# Patient Record
Sex: Female | Born: 1960 | Race: Black or African American | Hispanic: No | Marital: Single | Smoking: Former smoker
Health system: Southern US, Community
[De-identification: ages and names within clinical notes are randomized; demographics above are authoritative.]

## PROBLEM LIST (undated history)

## (undated) DIAGNOSIS — E669 Obesity, unspecified: Secondary | ICD-10-CM

## (undated) DIAGNOSIS — Z86718 Personal history of other venous thrombosis and embolism: Secondary | ICD-10-CM

## (undated) DIAGNOSIS — F419 Anxiety disorder, unspecified: Secondary | ICD-10-CM

## (undated) DIAGNOSIS — M542 Cervicalgia: Secondary | ICD-10-CM

## (undated) DIAGNOSIS — I10 Essential (primary) hypertension: Secondary | ICD-10-CM

## (undated) DIAGNOSIS — E559 Vitamin D deficiency, unspecified: Secondary | ICD-10-CM

## (undated) DIAGNOSIS — K760 Fatty (change of) liver, not elsewhere classified: Secondary | ICD-10-CM

## (undated) DIAGNOSIS — M25519 Pain in unspecified shoulder: Secondary | ICD-10-CM

## (undated) DIAGNOSIS — E785 Hyperlipidemia, unspecified: Secondary | ICD-10-CM

## (undated) DIAGNOSIS — M2550 Pain in unspecified joint: Secondary | ICD-10-CM

## (undated) HISTORY — PX: BREAST SURGERY: SHX581

## (undated) HISTORY — DX: Obesity, unspecified: E66.9

## (undated) HISTORY — DX: Pain in unspecified shoulder: M25.519

## (undated) HISTORY — DX: Fatty (change of) liver, not elsewhere classified: K76.0

## (undated) HISTORY — PX: REDUCTION MAMMAPLASTY: SUR839

## (undated) HISTORY — DX: Pain in unspecified joint: M25.50

## (undated) HISTORY — DX: Personal history of other venous thrombosis and embolism: Z86.718

## (undated) HISTORY — DX: Anxiety disorder, unspecified: F41.9

## (undated) HISTORY — DX: Cervicalgia: M54.2

## (undated) HISTORY — DX: Vitamin D deficiency, unspecified: E55.9

---

## 2007-01-18 ENCOUNTER — Encounter: Admission: RE | Admit: 2007-01-18 | Discharge: 2007-01-18 | Payer: Self-pay | Admitting: Internal Medicine

## 2010-07-31 HISTORY — PX: DILATION AND CURETTAGE OF UTERUS: SHX78

## 2010-07-31 HISTORY — PX: ABDOMINAL HYSTERECTOMY: SHX81

## 2010-07-31 HISTORY — PX: COLONOSCOPY: SHX174

## 2010-08-03 ENCOUNTER — Encounter (INDEPENDENT_AMBULATORY_CARE_PROVIDER_SITE_OTHER): Payer: Self-pay | Admitting: *Deleted

## 2010-08-08 ENCOUNTER — Encounter (INDEPENDENT_AMBULATORY_CARE_PROVIDER_SITE_OTHER): Payer: Self-pay

## 2010-08-09 ENCOUNTER — Ambulatory Visit
Admission: RE | Admit: 2010-08-09 | Discharge: 2010-08-09 | Payer: Self-pay | Source: Home / Self Care | Attending: Internal Medicine | Admitting: Internal Medicine

## 2010-08-21 ENCOUNTER — Encounter: Payer: Self-pay | Admitting: Internal Medicine

## 2010-08-22 ENCOUNTER — Telehealth: Payer: Self-pay | Admitting: Internal Medicine

## 2010-08-23 ENCOUNTER — Ambulatory Visit
Admission: RE | Admit: 2010-08-23 | Discharge: 2010-08-23 | Payer: Self-pay | Source: Home / Self Care | Attending: Internal Medicine | Admitting: Internal Medicine

## 2010-08-23 ENCOUNTER — Encounter: Payer: Self-pay | Admitting: Internal Medicine

## 2010-09-01 ENCOUNTER — Encounter (HOSPITAL_COMMUNITY)
Admission: RE | Admit: 2010-09-01 | Discharge: 2010-09-01 | Disposition: A | Discharge status: Home / Self Care | Payer: 59 | Source: Ambulatory Visit | Attending: Obstetrics and Gynecology | Admitting: Obstetrics and Gynecology

## 2010-09-01 DIAGNOSIS — Z01810 Encounter for preprocedural cardiovascular examination: Secondary | ICD-10-CM | POA: Insufficient documentation

## 2010-09-01 DIAGNOSIS — Z01812 Encounter for preprocedural laboratory examination: Secondary | ICD-10-CM | POA: Insufficient documentation

## 2010-09-01 LAB — COMPREHENSIVE METABOLIC PANEL
ALT: 11 U/L (ref 0–35)
ALT: 39 U/L — ABNORMAL HIGH (ref 0–35)
AST: 8 U/L (ref 0–37)
AST: 41 U/L — ABNORMAL HIGH (ref 0–37)
Albumin: 2.7 g/dL — ABNORMAL LOW (ref 3.5–5.2)
Alkaline Phosphatase: 102 U/L (ref 39–117)
BUN: 5 mg/dL — ABNORMAL LOW (ref 6–23)
CO2: 21 mEq/L (ref 19–32)
Calcium: 9.2 mg/dL (ref 8.4–10.5)
Chloride: 105 mEq/L (ref 96–112)
Creatinine, Ser: 0.72 mg/dL (ref 0.4–1.2)
GFR calc non Af Amer: >60 mL/min (ref >60)
Glucose, Bld: 98 mg/dL (ref 70–99)
Potassium: 4 mEq/L (ref 3.5–5.1)
Sodium: 138 mEq/L (ref 135–145)
Total Bilirubin: 0.3 mg/dL (ref 0.3–1.2)
Total Protein: 6.2 g/dL (ref 6.0–8.3)

## 2010-09-01 LAB — URINALYSIS, ROUTINE W REFLEX MICROSCOPIC
Bilirubin Urine: NEGATIVE
Hgb urine dipstick: NEGATIVE
Protein, ur: NEGATIVE mg/dL
Urine Glucose, Fasting: NEGATIVE mg/dL
Urobilinogen, UA: 0.2 mg/dL (ref 0.0–1.0)

## 2010-09-01 LAB — CBC
HCT: 34.7 % — ABNORMAL LOW (ref 36.0–46.0)
HCT: 38.7 % (ref 36.0–46.0)
Hemoglobin: 11.1 g/dL — ABNORMAL LOW (ref 12.0–15.0)
MCV: 86.2 fL (ref 78.0–100.0)
MCV: 89 fL (ref 78.0–100.0)
Platelets: 230 10*3/uL (ref 150–400)
RBC: 4.49 MIL/uL (ref 3.87–5.11)
WBC: 8 10*3/uL (ref 4.0–10.5)

## 2010-09-01 NOTE — Letter (Signed)
Summary: Pre Visit Letter Revised  [facility]  [address] [place], [place] [address]   Phone: [phone]  Fax: [phone]        08/03/2010 MRN: [id number] Tanya Escobar [address] [place], [place]  [address]             Procedure Date:  08/23/2010 8:00   Direct colon-Dr. Juanda Chance   Welcome to the Gastroenterology Division at [facility].    You are scheduled to see a nurse for your pre-procedure visit on 08/09/09 at 3:30 on the 3rd floor at [facility], [address]  We ask that you try to arrive at our office 15 minutes prior to your appointment time to allow for check-in.  Please take a minute to review the attached form.  If you answer "Yes" to one or more of the questions on the first page, we ask that you call the person listed at your earliest opportunity.  If you answer "No" to all of the questions, please complete the rest of the form and bring it to your appointment.    Your nurse visit will consist of discussing your medical and surgical history, your immediate family medical history, and your medications.   If you are unable to list all of your medications on the form, please bring the medication bottles to your appointment and we will list them.  We will need to be aware of both prescribed and over the counter drugs.  We will need to know exact dosage information as well.    Please be prepared to read and sign documents such as consent forms, a financial agreement, and acknowledgement forms.  If necessary, and with your consent, a friend or relative is welcome to sit-in on the nurse visit with you.  Please bring your insurance card so that we may make a copy of it.  If your insurance requires a referral to see a specialist, please bring your referral form from your primary care physician.  No co-pay is required for this nurse visit.     If you cannot keep your appointment, please call [phone] to cancel or reschedule prior to  your appointment date.  This allows [place] the opportunity to schedule an appointment for another patient in need of care.    Thank you for choosing [facility] for your medical needs.  We appreciate the opportunity to care for you.  Please visit [place] at our website  to learn more about our practice.  Sincerely, The Gastroenterology Division

## 2010-09-01 NOTE — Procedures (Signed)
Summary: Colonoscopy  Patient: Tanya Escobar Note: All result statuses are Final unless otherwise noted.  Tests: (1) Colonoscopy (COL)   COL Colonoscopy           DONE     [facility]     [address]     [place], [place]  [address]           COLONOSCOPY PROCEDURE REPORT           PATIENT:  Tanya Escobar, Tanya Escobar  MR#:  [id number]     BIRTHDATE:  25-Apr-1961, 49 yrs. old  GENDER:  female     ENDOSCOPIST:  Hedwig Morton. Juanda Chance, MD     REF. BY:  Harold Hedge, M.D.     PROCEDURE DATE:  08/23/2010     PROCEDURE:  Colonoscopy [address]2725     ASA CLASS:  Class II     INDICATIONS:  family history of colon cancer mother with colon     cancer in her 31'ies     MEDICATIONS:   Versed 10 mg, Fentanyl 100 mcg           DESCRIPTION OF PROCEDURE:   After the risks benefits and     alternatives of the procedure were thoroughly explained, informed     consent was obtained.  Digital rectal exam was performed and     revealed no rectal masses.   The LB 180AL [id number] endoscope was     introduced through the anus and advanced to the cecum, which was     identified by both the appendix and ileocecal valve, without     limitations.  The quality of the prep was good, using MiraLax.     The instrument was then slowly withdrawn as the colon was fully     examined.     <<PROCEDUREIMAGES>>           FINDINGS:  No polyps or cancers were seen (see image1, image2,     image3, and image4).   Retroflexed views in the rectum revealed no     abnormalities.    The scope was then withdrawn from the patient     and the procedure completed.           COMPLICATIONS:  None     ENDOSCOPIC IMPRESSION:     1) No polyps or cancers     2) Normal colonoscopy     RECOMMENDATIONS:     1) high fiber diet     REPEAT EXAM:  In 5 year(s) for.           ______________________________     Hedwig Morton. Juanda Chance, MD           CC:           n.     eSIGNED:   Hedwig Morton. Airi Copado at 08/23/2010 08:31 AM           Eveline Keto,  [id number]  Note: An exclamation mark (!) indicates a result that was not dispersed into the flowsheet. Document Creation Date: 08/23/2010 8:31 AM _______________________________________________________________________  (1) Order result status: Final Collection or observation date-time: 08/23/2010 08:25 Requested date-time:  Receipt date-time:  Reported date-time:  Referring Physician:   Ordering Physician: Lina Sar [phone]) Specimen Source:  Source: Launa Grill Order Number: [id number] Lab site:   Appended Document: Colonoscopy    Clinical Lists Changes  Observations: Added new observation of COLONNXTDUE: 08/2015 (08/23/2010 15:13)

## 2010-09-01 NOTE — Progress Notes (Signed)
Summary: Procedure questions  Phone Note Call from Patient Call back at Home Phone [phone] Call back at [phone]   Caller: Patient Call For: Dr. Juanda Chance Reason for Call: Talk to Nurse Summary of Call: Patient has questions about procedure tomorrow Initial call taken by: [place] Johnson,  August 22, 2010 9:59 AM  Follow-up for Phone Call        Returned pt's call and answered her question concerning being on menstual period and ok to wear tampon or pad. Follow-up by: Alease Frame RN,  August 22, 2010 11:09 AM

## 2010-09-01 NOTE — Miscellaneous (Signed)
Summary: Lec previsit  Clinical Lists Changes  Medications: Added new medication of MIRALAX   POWD (POLYETHYLENE GLYCOL 3350) As per prep  instructions. - Signed Added new medication of REGLAN 10 MG  TABS (METOCLOPRAMIDE HCL) As per prep instructions. - Signed Added new medication of DULCOLAX 5 MG  TBEC (BISACODYL) Day before procedure take 2 at 3pm and 2 at 8pm. - Signed Rx of MIRALAX   POWD (POLYETHYLENE GLYCOL 3350) As per prep  instructions.;  #255gm x 0;  Signed;  Entered by: Ulis Rias RN;  Authorized by: Hart Carwin MD;  Method used: Electronically to [facility]  [address] [id number] [address] [place], [place]  [address], Ph: [phone] or [phone], Fax: [phone] Rx of REGLAN 10 MG  TABS (METOCLOPRAMIDE HCL) As per prep instructions.;  #2 x 0;  Signed;  Entered by: Ulis Rias RN;  Authorized by: Hart Carwin MD;  Method used: Electronically to [facility]  [address] [id number] [address] [place], [place]  [address], Ph: [phone] or [phone], Fax: [phone] Rx of DULCOLAX 5 MG  TBEC (BISACODYL) Day before procedure take 2 at 3pm and 2 at 8pm.;  #4 x 0;  Signed;  Entered by: Ulis Rias RN;  Authorized by: Hart Carwin MD;  Method used: Electronically to [facility]  [address] [id number] [address] [place], [place]  [address], Ph: [phone] or [phone], Fax: [phone] Observations: Added new observation of NKA: T (08/09/2010 10:35)    Prescriptions: DULCOLAX 5 MG  TBEC (BISACODYL) Day before procedure take 2 at 3pm and 2 at 8pm.  #4 x 0   Entered by:   Ulis Rias RN   Authorized by:   Hart Carwin MD   Signed by:   Ulis Rias RN on 08/09/2010   Method used:   Electronically to        [facility] [id number] (retail)       [address]       [place], [place]  [address]       Ph: [phone] or [phone]       Fax: [phone]   RxID:   [id number] REGLAN 10 MG  TABS (METOCLOPRAMIDE HCL) As  per prep instructions.  #2 x 0   Entered by:   Ulis Rias RN   Authorized by:   Hart Carwin MD   Signed by:   Ulis Rias RN on 08/09/2010   Method used:   [facility]ronically to        [facility]  [address] [facility] ([facility])       [address]       [place], [place]  [address]       Ph: [phone] or [phone]       Fax: [phone]   RxID:   [id number] MIRALAX   POWD (POLYETHYLENE GLYCOL 3350) As per prep  instructions.  #255gm x 0   Entered by:   Ulis Rias RN   Authorized by:   Hart Carwin MD   Signed by:   Ulis Rias RN on 08/09/2010   Method used:   [facility]ronically to        [facility]  [address] [id number]       [address]       [place], [place]  [address]       Ph:  [phone] or [phone]       Fax: [phone]   RxID:   [id number]

## 2010-09-01 NOTE — Letter (Signed)
Summary: [facility] Instructions  [facility]  [address] [place], [place] [address]   Phone: [phone]  Fax: [phone]       Tanya Escobar    07-Dec-1960    MRN: [id number]       Procedure Day /Date:  Tuesday 08/23/2010     Arrival Time: 7:30 am     Procedure Time: 8:00 am     Location of Procedure:                    _ x_  [facility] (4th Floor)    PREPARATION FOR COLONOSCOPY WITH MIRALAX  Starting 5 days prior to your procedure Thursday 1/19 do not eat nuts, seeds, popcorn, corn, beans, peas,  salads, or any raw vegetables.  Do not take any fiber supplements (e.g. Metamucil, Citrucel, and Benefiber). ____________________________________________________________________________________________________   THE DAY BEFORE YOUR PROCEDURE         DATE: Monday 1/23  1   Drink clear liquids the entire day-NO SOLID FOOD  2   Do not drink anything colored red or purple.  Avoid juices with pulp.  No orange juice.  3   Drink at least 64 oz. (8 glasses) of fluid/clear liquids during the day to prevent dehydration and help the prep work efficiently.  CLEAR LIQUIDS INCLUDE: Water Jello Ice Popsicles Tea (sugar ok, no milk/cream) Powdered fruit flavored drinks Coffee (sugar ok, no milk/cream) Gatorade Juice: apple, white grape, white cranberry  Lemonade Clear bullion, consomm, broth Carbonated beverages (any kind) Strained chicken noodle soup Hard Candy  4   Mix the entire bottle of Miralax with 64 oz. of Gatorade/Powerade in the morning and put in the refrigerator to chill.  5   At 3:00 pm take 2 Dulcolax/Bisacodyl tablets.  6   At 4:30 pm take one Reglan/Metoclopramide tablet.  7  Starting at 5:00 pm drink one 8 oz glass of the Miralax mixture every 15-20 minutes until you have finished drinking the entire 64 oz.  You should finish drinking prep around 7:30 or 8:00 pm.  8   If you are nauseated, you may take the 2nd Reglan/Metoclopramide  tablet at 6:30 pm.        9    At 8:00 pm take 2 more DULCOLAX/Bisacodyl tablets.     THE DAY OF YOUR PROCEDURE      DATE:  Tuesday 1/24  You may drink clear liquids until 6:00 am  (2 HOURS BEFORE PROCEDURE).   MEDICATION INSTRUCTIONS  Unless otherwise instructed, you should take regular prescription medications with a small sip of water as early as possible the morning of your procedure.  Additional medication instructions: Do not take Benicar/HCTZ am of procedure         OTHER INSTRUCTIONS  You will need a responsible adult at least 51 years of age to accompany you and drive you home.   This person must remain in the waiting room during your procedure.  Wear loose fitting clothing that is easily removed.  Leave jewelry and other valuables at home.  However, you may wish to bring a book to read or an iPod/MP3 player to listen to music as you wait for your procedure to start.  Remove all body piercing jewelry and leave at home.  Total time from sign-in until discharge is approximately 2-3 hours.  You should go home directly after your procedure and rest.  You can resume normal activities the day after your procedure.  The day of  your procedure you should not:   Drive   Make legal decisions   Operate machinery   Drink alcohol   Return to work  You will receive specific instructions about eating, activities and medications before you leave.   The above instructions have been reviewed and explained to me by   Ulis Rias RN  August 09, 2010 10:57 AM     I fully understand and can verbalize these instructions _____________________________ Date _______

## 2010-09-05 NOTE — H&P (Signed)
  Tanya Escobar, Tanya Escobar              ACCOUNT NO.:  [id number]  MEDICAL RECORD NO.:  [id number]          PATIENT TYPE:  AMB  LOCATION:  [facility]                           [facility]ILITY:  [facility]  PHYSICIAN:  Guy Sandifer. Henderson Cloud, M.D. DATE OF BIRTH:  1960-08-30  DATE OF ADMISSION:  09/08/2010 DATE OF DISCHARGE:                             HISTORY & PHYSICAL   CHIEF COMPLAINT:  Heavy menses.  HISTORY OF PRESENT ILLNESS:  This patient is a 50 year old divorced black female G1, P1, status post tubal ligation, who has a history of uterine leiomyomata and increasingly heavy menses.  Ultrasound in my office on August 10, 2010 revealed uterus measuring 10.5 x 6.7 x 8.9 cm.  Multiple intramural leiomyomata are noted ranging from 1.0 to 3.1 cm.  There is a 5.2-cm subserosal fibroid.  On sonohysterogram, there are multiple masses ranging from 12-19 mm, consistent with polyps and/or possible submucous fibroids.  Ovaries appeared normal.  After discussion of options, she is being admitted for hysteroscopy, resectoscope, dilatation and curettage.  Potential risks and complications have been discussed preoperatively.  PAST MEDICAL HISTORY: 1. Abnormal Pap smear. 2. Chronic hypertension. 3. Depression. 4. Headache.  PAST SURGICAL HISTORY:  Breast reduction in 2000, tubal ligation in 1995.  OBSTETRICAL HISTORY:  Vaginal delivery x2.  Cesarean section x1.  FAMILY HISTORY:  Positive for asthma, hepatitis, hiatal hernia, arthritis, diabetes, chronic hypertension, breast and colon cancer.  MEDICATIONS:  Losartan daily, sertraline daily, Loestrin daily, Dolgic Plus p.r.n. headache.  No known drug allergies.  SOCIAL HISTORY:  Denies tobacco, alcohol, or drug abuse.  REVIEW OF SYSTEMS:  NEUROLOGIC:  Headache as above.  CARDIAC:  Denies chest pain.  PULMONARY:  Denies shortness of breath.  PHYSICAL EXAMINATION:  VITAL SIGNS:  Height 5 feet 3 inches, weight 216 pounds, blood pressure 128/82. LUNGS:  Clear to  auscultation. HEART:  Regular rate and rhythm. ABDOMEN:  Obese, soft, nontender without masses. PELVIC:  Uterus is enlarged although exam is highly compromised due to the patient's habitus. EXTREMITIES:  Grossly within normal limits. NEUROLOGICAL:  Grossly within normal limits.  ASSESSMENT:  Menorrhagia.  PLAN:  Hysteroscopy, resectoscope, dilatation and curettage.     Guy Sandifer Henderson Cloud, M.D.     JET/MEDQ  D:  09/02/2010  T:  09/03/2010  Job:  [id number]  Electronically Signed by Harold Hedge M.D. on 09/05/2010 09:03:19 AM

## 2010-09-08 ENCOUNTER — Ambulatory Visit (HOSPITAL_COMMUNITY)
Admission: RE | Admit: 2010-09-08 | Discharge: 2010-09-08 | Disposition: A | Discharge status: Home / Self Care | Payer: 59 | Source: Ambulatory Visit | Attending: Obstetrics and Gynecology | Admitting: Obstetrics and Gynecology

## 2010-09-08 ENCOUNTER — Other Ambulatory Visit: Payer: Self-pay | Admitting: Obstetrics and Gynecology

## 2010-09-08 DIAGNOSIS — Z01812 Encounter for preprocedural laboratory examination: Secondary | ICD-10-CM | POA: Insufficient documentation

## 2010-09-08 DIAGNOSIS — I10 Essential (primary) hypertension: Secondary | ICD-10-CM | POA: Insufficient documentation

## 2010-09-08 DIAGNOSIS — N920 Excessive and frequent menstruation with regular cycle: Secondary | ICD-10-CM | POA: Insufficient documentation

## 2010-09-08 DIAGNOSIS — D251 Intramural leiomyoma of uterus: Secondary | ICD-10-CM | POA: Insufficient documentation

## 2010-09-08 DIAGNOSIS — D252 Subserosal leiomyoma of uterus: Secondary | ICD-10-CM | POA: Insufficient documentation

## 2010-09-08 DIAGNOSIS — N840 Polyp of corpus uteri: Secondary | ICD-10-CM | POA: Insufficient documentation

## 2010-09-08 DIAGNOSIS — D250 Submucous leiomyoma of uterus: Secondary | ICD-10-CM | POA: Insufficient documentation

## 2010-09-08 DIAGNOSIS — Z01818 Encounter for other preprocedural examination: Secondary | ICD-10-CM | POA: Insufficient documentation

## 2010-09-17 NOTE — Op Note (Signed)
  [name], STOCKBURGER              ACCOUNT NO.:  [id number]  MEDICAL RECORD NO.:  [id number]           PATIENT TYPE:  O  LOCATION:  [facility]                          [facility]ILITY:  [facility]  PHYSICIAN:  Guy Sandifer. Henderson Cloud, M.D. DATE OF BIRTH:  [birth year]  DATE OF PROCEDURE:  09/08/2010 DATE OF DISCHARGE:                              OPERATIVE REPORT   PREOPERATIVE DIAGNOSIS:  Menorrhagia.  POSTOPERATIVE DIAGNOSIS:  Menorrhagia.  PROCEDURES:  Hysteroscopy with resection of submucous fibroid and endometrial polyp with MyoSure and dilatation and curettage.  SURGEON:  Guy Sandifer. Henderson Cloud, MD  ANESTHESIA:  General with LMA.  SPECIMENS: 1. Endometrial section. 2. Endometrial curettings.  Both to Pathology.  I and Os of distending media, 170 mL deficit.  INDICATIONS AND CONSENT:  This patient is a 50 year old divorced black female G1, P1, status post tubal ligation with heavy menses.  Details are dictated in the history and physical.  Hysteroscopy, resection of uterine endocavitary masses, dilatation and curettage was discussed with the patient preoperatively.  Potential risks and complications have been discussed preoperatively including but not limited to infection, uterine perforation, organ damage, bleeding requiring transfusion of blood products with HIV and hepatitis acquisition, DVT, PE, pneumonia, laparotomy, laparoscopy, hysterectomy, recurrent abnormal bleeding, pelvic pain, painful intercourse.  All questions have been answered and consent is signed on the chart.  FINDINGS:  There is approximately 15-mm submucous fibroid in the center of the posterior uterine wall and a 2-cm pedunculated polypoid-type structure at the 3 o'clock position on the mid endometrial cavity on the left.  Both fallopian tube ostia identified.  PROCEDURE IN DETAILS:  The patient was taken to the operating room where she was identified and general anesthesia was induced via LMA.  She was then placed in dorsal  lithotomy position.  Time-out was undertaken.  She was prepped, bladder straight catheterized and draped in a sterile fashion.  Bivalve speculum was placed in the vagina.  Anterior cervical lip was injected with 0.5% plain Xylocaine and grasped with single-tooth tenaculum.  Paracervical block at 2, 4, 5, 7, 8 and 10 o'clock positions was placed with approximately 20 mL of the same solution.  The MyoSure 0- degree hysteroscope was then placed in the endocervical canal and advanced under direct visualization using distending media.  The above findings are noted.  Under good visualization, the submucous leiomyoma was resected with the MyoSure down to the level of the surrounding tissue.  The polyp was also resected in a similar fashion.  Reinspection revealed the cavity to be otherwise clean.  The MyoSure device was then removed.  Curettage was then carried out and specimen was sent to pathology as well.  Good hemostasis was noted.  All instruments were removed.  All counts were correct.  The patient was awakened, taken to recovery room in stable condition.     Guy Sandifer Henderson Cloud, M.D.     JET/MEDQ  D:  09/08/2010  T:  09/08/2010  Job:  [id number]  Electronically Signed by Harold Hedge M.D. on 09/17/2010 12:10:19 PM

## 2011-01-26 ENCOUNTER — Ambulatory Visit (HOSPITAL_COMMUNITY): Admission: RE | Admit: 2011-01-26 | Payer: Self-pay | Source: Ambulatory Visit | Admitting: Obstetrics and Gynecology

## 2011-06-26 ENCOUNTER — Other Ambulatory Visit: Payer: Self-pay | Admitting: Obstetrics and Gynecology

## 2012-06-10 ENCOUNTER — Other Ambulatory Visit: Payer: Self-pay | Admitting: Internal Medicine

## 2012-06-10 DIAGNOSIS — Z1231 Encounter for screening mammogram for malignant neoplasm of breast: Secondary | ICD-10-CM

## 2012-07-16 ENCOUNTER — Ambulatory Visit
Admission: RE | Admit: 2012-07-16 | Discharge: 2012-07-16 | Disposition: A | Discharge status: Home / Self Care | Payer: 59 | Source: Ambulatory Visit | Attending: Internal Medicine | Admitting: Internal Medicine

## 2012-07-16 DIAGNOSIS — Z1231 Encounter for screening mammogram for malignant neoplasm of breast: Secondary | ICD-10-CM

## 2014-03-24 ENCOUNTER — Encounter: Payer: Self-pay | Admitting: Internal Medicine

## 2014-04-01 ENCOUNTER — Other Ambulatory Visit: Payer: Self-pay | Admitting: Internal Medicine

## 2014-04-01 DIAGNOSIS — Z1231 Encounter for screening mammogram for malignant neoplasm of breast: Secondary | ICD-10-CM

## 2014-04-14 ENCOUNTER — Ambulatory Visit
Admission: RE | Admit: 2014-04-14 | Discharge: 2014-04-14 | Disposition: A | Discharge status: Home / Self Care | Payer: 59 | Source: Ambulatory Visit | Attending: Internal Medicine | Admitting: Internal Medicine

## 2014-04-14 DIAGNOSIS — Z1231 Encounter for screening mammogram for malignant neoplasm of breast: Secondary | ICD-10-CM

## 2014-10-13 ENCOUNTER — Emergency Department (HOSPITAL_COMMUNITY): Payer: 59

## 2014-10-13 ENCOUNTER — Emergency Department (HOSPITAL_COMMUNITY)
Admission: EM | Admit: 2014-10-13 | Discharge: 2014-10-13 | Disposition: A | Discharge status: Home / Self Care | Payer: 59 | Attending: Emergency Medicine | Admitting: Emergency Medicine

## 2014-10-13 ENCOUNTER — Encounter (HOSPITAL_COMMUNITY): Payer: Self-pay | Admitting: *Deleted

## 2014-10-13 DIAGNOSIS — I82621 Acute embolism and thrombosis of deep veins of right upper extremity: Secondary | ICD-10-CM | POA: Diagnosis not present

## 2014-10-13 DIAGNOSIS — Z880 Allergy status to penicillin: Secondary | ICD-10-CM | POA: Insufficient documentation

## 2014-10-13 DIAGNOSIS — M79631 Pain in right forearm: Secondary | ICD-10-CM | POA: Diagnosis present

## 2014-10-13 DIAGNOSIS — I10 Essential (primary) hypertension: Secondary | ICD-10-CM | POA: Diagnosis not present

## 2014-10-13 DIAGNOSIS — M79609 Pain in unspecified limb: Secondary | ICD-10-CM | POA: Diagnosis not present

## 2014-10-13 DIAGNOSIS — M7989 Other specified soft tissue disorders: Secondary | ICD-10-CM | POA: Diagnosis not present

## 2014-10-13 DIAGNOSIS — R51 Headache: Secondary | ICD-10-CM | POA: Insufficient documentation

## 2014-10-13 DIAGNOSIS — M79604 Pain in right leg: Secondary | ICD-10-CM | POA: Diagnosis not present

## 2014-10-13 DIAGNOSIS — E785 Hyperlipidemia, unspecified: Secondary | ICD-10-CM | POA: Diagnosis not present

## 2014-10-13 DIAGNOSIS — Z79899 Other long term (current) drug therapy: Secondary | ICD-10-CM | POA: Diagnosis not present

## 2014-10-13 HISTORY — DX: Essential (primary) hypertension: I10

## 2014-10-13 HISTORY — DX: Hyperlipidemia, unspecified: E78.5

## 2014-10-13 LAB — BASIC METABOLIC PANEL
Anion gap: 7 (ref 5–15)
BUN: 12 mg/dL (ref 6–23)
CO2: 25 mmol/L (ref 19–32)
Calcium: 9.6 mg/dL (ref 8.4–10.5)
Chloride: 105 mmol/L (ref 96–112)
Creatinine, Ser: 0.77 mg/dL (ref 0.50–1.10)
GFR calc Af Amer: >90 mL/min (ref >90)
GFR calc non Af Amer: >90 mL/min (ref >90)
Glucose, Bld: 108 mg/dL — ABNORMAL HIGH (ref 70–99)
Potassium: 3.8 mmol/L (ref 3.5–5.1)
Sodium: 137 mmol/L (ref 135–145)

## 2014-10-13 LAB — BRAIN NATRIURETIC PEPTIDE: B Natriuretic Peptide: 7.1 pg/mL (ref 0.0–100.0)

## 2014-10-13 LAB — CBC WITH DIFFERENTIAL/PLATELET
Basophils Absolute: 0 10*3/uL (ref 0.0–0.1)
Basophils Relative: 0 % (ref 0–1)
Eosinophils Absolute: 0.1 10*3/uL (ref 0.0–0.7)
Eosinophils Relative: 1 % (ref 0–5)
HCT: 43.1 % (ref 36.0–46.0)
Hemoglobin: 14.7 g/dL (ref 12.0–15.0)
Lymphocytes Relative: 33 % (ref 12–46)
Lymphs Abs: 2.1 10*3/uL (ref 0.7–4.0)
MCH: 29.8 pg (ref 26.0–34.0)
MCHC: 34.1 g/dL (ref 30.0–36.0)
MCV: 87.2 fL (ref 78.0–100.0)
Monocytes Absolute: 0.4 10*3/uL (ref 0.1–1.0)
Monocytes Relative: 7 % (ref 3–12)
Neutro Abs: 3.8 10*3/uL (ref 1.7–7.7)
Neutrophils Relative %: 59 % (ref 43–77)
Platelets: 206 10*3/uL (ref 150–400)
RBC: 4.94 MIL/uL (ref 3.87–5.11)
RDW: 14.2 % (ref 11.5–15.5)
WBC: 6.4 10*3/uL (ref 4.0–10.5)

## 2014-10-13 LAB — PROTIME-INR
INR: 0.97 (ref 0.00–1.49)
Prothrombin Time: 13 seconds (ref 11.6–15.2)

## 2014-10-13 LAB — I-STAT TROPONIN, ED: Troponin i, poc: 0 ng/mL (ref 0.00–0.08)

## 2014-10-13 LAB — APTT: aPTT: 28 seconds (ref 24–37)

## 2014-10-13 MED ORDER — XARELTO VTE STARTER PACK 15 & 20 MG PO TBPK: 15.0000 mg | ORAL_TABLET | ORAL | Status: DC

## 2014-10-13 MED ORDER — IOHEXOL 350 MG/ML SOLN
100.0000 mL | Freq: Once | INTRAVENOUS | Status: AC | PRN
  Administered 2014-10-13: 100 mL via INTRAVENOUS

## 2014-10-13 MED ORDER — RIVAROXABAN 15 MG PO TABS
15.0000 mg | ORAL_TABLET | Freq: Once | ORAL | Status: AC
  Administered 2014-10-13: 15 mg via ORAL
  Filled 2014-10-13: qty 1

## 2014-10-13 NOTE — Progress Notes (Addendum)
*  Preliminary Results* Right upper extremity venous duplex completed. Right upper extremity is positive for acute occlusive deep vein thrombosis involving the right radial and ulnar veins.  Preliminary results discussed with Noland Fordyce, PA-C.  10/13/2014 11:42 AM  Maudry Mayhew, RVT, RDCS, RDMS

## 2014-10-13 NOTE — Progress Notes (Signed)
*  PRELIMINARY RESULTS* Vascular Ultrasound Right lower extremity venous duplex has been completed.  Preliminary findings: negative for DVT   Landry Mellow, RDMS, RVT  10/13/2014, 1:17 PM

## 2014-10-13 NOTE — ED Notes (Signed)
Patient transported to CT 

## 2014-10-13 NOTE — ED Notes (Signed)
Pt states that she has had rt arm pain and swelling since yesterday. Pt states that she has been seen for pain in the arm recently. Pt noted to have swelling in her rt forearm. No heat/reddness/tenderness noted. Equal pulses and cap refill.

## 2014-10-13 NOTE — ED Provider Notes (Signed)
CSN: [id number]     Arrival date & time 10/13/14  0706 History   First MD Initiated Contact with Patient 10/13/14 1032     Chief Complaint  Patient presents with  . Extremity Pain     (Consider location/radiation/quality/duration/timing/severity/associated sxs/prior Treatment) HPI  Pt is a 54yo female with hx of HTN and hyperlipidemia, presenting to ED with multiple complaints including pain and swelling of right forearm.  Pt states she has been having right forearm pain intermittently for 2 months as well as right leg pain and intermittent headaches.  Right arm pain is aching and sore, 4/10 at worst.  Pain was worse this morning when she woke up, she also noticed new onset mild swelling in her right forearm as well as a difference in BP in her right vs left arm.  Pt states she was scheduled to f/u with cardiology tomorrow as scheduled by her PCP.  She reports mild SOB, states it feels like it is difficult to swallow at times. Denies SOB or CP at this time. Denies leg swelling but does report right leg pain that is aching and burning at times. Reports having a nerve study done, ordered by her PCP to r/o neuropathy. Pt states that test was normal. She denies lower back pain. Denies recent injury to right arm or right leg.  Denies change in skin color in right arm or right leg.  Denies fever, chills, n/v/d.   Past Medical History  Diagnosis Date  . Hypertension   . Hyperlipidemia    Past Surgical History  Procedure Laterality Date  . Abdominal hysterectomy    . Breast surgery     No family history on file. History  Substance Use Topics  . Smoking status: Never Smoker   . Smokeless tobacco: Not on file  . Alcohol Use: Yes     Comment: wine occasionally   OB History    No data available     Review of Systems  Constitutional: Negative for fever and chills.  Respiratory: Negative for cough and shortness of breath.   Cardiovascular: Negative for chest pain, palpitations and leg  swelling.  Gastrointestinal: Negative for nausea, vomiting, abdominal pain and diarrhea.  Musculoskeletal: Positive for myalgias. Negative for back pain, joint swelling, arthralgias, neck pain and neck stiffness.       Right arm and leg pain, right arm swelling  Skin: Negative for color change, pallor, rash and wound.  Neurological: Positive for headaches. Negative for light-headedness.  All other systems reviewed and are negative.     Allergies  Penicillins  Home Medications   Prior to Admission medications   Medication Sig Start Date End Date Taking? Authorizing Provider  Flaxseed, Linseed, (FLAX SEED OIL) 1000 MG CAPS Take 1,000 mg by mouth daily.   Yes Historical Provider, MD  fluticasone (FLONASE) 50 MCG/ACT nasal spray Place 1 spray into both nostrils as needed for allergies or rhinitis.   Yes Historical Provider, MD  losartan (COZAAR) 25 MG tablet Take 25 mg by mouth daily.   Yes Historical Provider, MD  Omega-3 Fatty Acids (FISH OIL) 1000 MG CAPS Take 1 capsule by mouth daily.   Yes Historical Provider, MD  PROAIR HFA 108 (90 BASE) MCG/ACT inhaler Inhale 1 puff into the lungs every 6 (six) hours as needed for shortness of breath.  09/30/14  Yes Historical Provider, MD  sertraline (ZOLOFT) 50 MG tablet Take 75 mg by mouth daily. 10/06/14  Yes Historical Provider, MD  simvastatin (ZOCOR) 20 MG tablet Take  20 mg by mouth every evening. 10/06/14  Yes Historical Provider, MD  valACYclovir (VALTREX) 500 MG tablet Take 500 mg by mouth daily.   Yes Historical Provider, MD  XARELTO STARTER PACK 15 & 20 MG TBPK Take 15-20 mg by mouth as directed. Take as directed on package: Start with one 15mg  tablet by mouth twice a day with food. On Day 22, switch to one 20mg  tablet once a day with food. 10/13/14   Noland Fordyce, PA-C   BP 128/77 mmHg  Pulse 79  Temp(Src) 98.5 F (36.9 C) (Oral)  Resp 17  Ht 5\' 3"  (1.6 m)  Wt 209 lb (94.802 kg)  BMI 37.03 kg/m2  SpO2 99% Physical Exam  Constitutional:  She appears well-developed and well-nourished. No distress.  HENT:  Head: Normocephalic and atraumatic.  Eyes: Conjunctivae are normal. No scleral icterus.  Neck: Normal range of motion.  Cardiovascular: Normal rate, regular rhythm and normal heart sounds.   Pulmonary/Chest: Effort normal and breath sounds normal. No respiratory distress. She has no wheezes. She has no rales. She exhibits no tenderness.  Abdominal: Soft. Bowel sounds are normal. She exhibits no distension and no mass. There is no tenderness. There is no rebound and no guarding.  Musculoskeletal: Normal range of motion. She exhibits edema and tenderness.  Mild edema to right forearm compared to left. FROM right shoulder, elbow, and wrist. Mild tenderness to volar aspect right forearm.  5/5 strength bilaterally in upper and lower extremities. Right leg: FROM, no calf tenderness   Neurological: She is alert.  Skin: Skin is warm and dry. No rash noted. She is not diaphoretic. No erythema.  Nursing note and vitals reviewed.   ED Course  Procedures (including critical care time) Labs Review Labs Reviewed  BASIC METABOLIC PANEL - Abnormal; Notable for the following:    Glucose, Bld 108 (*)    All other components within normal limits  CBC WITH DIFFERENTIAL/PLATELET  BRAIN NATRIURETIC PEPTIDE  APTT  PROTIME-INR  Randolm Idol, ED    Imaging Review Dg Chest 2 View  10/13/2014   CLINICAL DATA:  Difficulty breathing.  Hypertension  EXAM: CHEST  2 VIEW  COMPARISON:  None.  FINDINGS: Lungs are clear. Heart size and pulmonary vascularity are normal. No adenopathy. No bone lesions.  IMPRESSION: No edema or consolidation.   Electronically Signed   By: Lowella Grip III M.D.   On: 10/13/2014 12:50   Ct Angio Chest Pe W/cm &/or Wo Cm  10/13/2014   CLINICAL DATA:  Right upper extremity DVT.  Short of breath.  EXAM: CT ANGIOGRAPHY CHEST WITH CONTRAST  TECHNIQUE: Multidetector CT imaging of the chest was performed using the  standard protocol during bolus administration of intravenous contrast. Multiplanar CT image reconstructions and MIPs were obtained to evaluate the vascular anatomy.  CONTRAST:  132mL OMNIPAQUE IOHEXOL 350 MG/ML SOLN  COMPARISON:  None.  FINDINGS: No filling defects in the pulmonary arterial tree to suggest acute pulmonary thromboembolism  No abnormal mediastinal adenopathy.  No pneumothorax.  No pleural effusion.  Clear lungs.  No acute bony deformity. Posterior disc osteophytes occur from T6 through T10. They encroach upon the central canal.  Review of the MIP images confirms the above findings.  IMPRESSION: No evidence of acute pulmonary thromboembolism.   Electronically Signed   By: Marybelle Killings M.D.   On: 10/13/2014 15:40     EKG Interpretation None      MDM   Final diagnoses:  Arm DVT (deep venous thromboembolism), acute, right  Right leg pain    Pt is a 54yo female presenting to ED with multiple complaints, but mainly right arm pain and swelling prompted pt to come to ED today.  Pt has also had intermittent headaches, SOB, and right leg pain. Pt is scheduled for f/u with cardiology, Dr. Nadyne Coombes tomorrow as pt was concerned for different BP in right and left arm.  Pt denies CP or SOB today.   Mild edema with tenderness in right forearm.  Right leg pain sounds sciatic pain in nature.  No calf tenderness, redness, warmth or edema.  Venous doppler of Right Upper extremity: positive for acute occlusive deep vein thrombosis involving the right radial and ulnar veins.  Due to Right Upper extremity being positive for DVT,  Right Lower extremity venous duplex also performed. Negative for DVT.  With c/o intermittent SOB, CT angio chest performed to r/o PE although lower concern as pt denies CP or SOB at this time, has heart-regular rate and rhythm, O2 Sat 97-100% on RA.  CT angio chest: negative for PE.  Pt started on Xarelto in ED. Will discharge home with starter pack. Encouraged pt to f/u  with Dr. Nadyne Coombes tomorrow as previously scheduled. Home care instructions provided.  Return precautions provided. Pt verbalized understanding and agreement with tx plan.  Discussed pt with Dr. Ralene Bathe during ED visit.  Agrees with assessment and plan.   Noland Fordyce, PA-C 10/13/14 [place], MD 10/13/14 [phone]

## 2014-10-13 NOTE — ED Notes (Signed)
IV obtained; could not get blood. Phlebotomy to be called.

## 2014-10-20 ENCOUNTER — Encounter (HOSPITAL_COMMUNITY): Payer: Self-pay | Admitting: Emergency Medicine

## 2014-10-20 ENCOUNTER — Emergency Department (HOSPITAL_COMMUNITY)
Admission: EM | Admit: 2014-10-20 | Discharge: 2014-10-20 | Disposition: A | Discharge status: Home / Self Care | Payer: 59 | Attending: Emergency Medicine | Admitting: Emergency Medicine

## 2014-10-20 ENCOUNTER — Ambulatory Visit (HOSPITAL_COMMUNITY)
Admission: RE | Admit: 2014-10-20 | Discharge: 2014-10-20 | Disposition: A | Discharge status: Home / Self Care | Payer: 59 | Source: Ambulatory Visit | Attending: Emergency Medicine | Admitting: Emergency Medicine

## 2014-10-20 DIAGNOSIS — E663 Overweight: Secondary | ICD-10-CM | POA: Insufficient documentation

## 2014-10-20 DIAGNOSIS — I10 Essential (primary) hypertension: Secondary | ICD-10-CM | POA: Insufficient documentation

## 2014-10-20 DIAGNOSIS — E785 Hyperlipidemia, unspecified: Secondary | ICD-10-CM | POA: Diagnosis not present

## 2014-10-20 DIAGNOSIS — Z79899 Other long term (current) drug therapy: Secondary | ICD-10-CM | POA: Diagnosis not present

## 2014-10-20 DIAGNOSIS — M79602 Pain in left arm: Secondary | ICD-10-CM | POA: Diagnosis present

## 2014-10-20 DIAGNOSIS — Z880 Allergy status to penicillin: Secondary | ICD-10-CM | POA: Insufficient documentation

## 2014-10-20 DIAGNOSIS — R51 Headache: Secondary | ICD-10-CM | POA: Diagnosis not present

## 2014-10-20 DIAGNOSIS — Z87891 Personal history of nicotine dependence: Secondary | ICD-10-CM | POA: Insufficient documentation

## 2014-10-20 DIAGNOSIS — Z86718 Personal history of other venous thrombosis and embolism: Secondary | ICD-10-CM

## 2014-10-20 DIAGNOSIS — M7989 Other specified soft tissue disorders: Secondary | ICD-10-CM | POA: Diagnosis not present

## 2014-10-20 DIAGNOSIS — I82621 Acute embolism and thrombosis of deep veins of right upper extremity: Secondary | ICD-10-CM | POA: Diagnosis not present

## 2014-10-20 MED ORDER — KETOROLAC TROMETHAMINE 60 MG/2ML IM SOLN
60.0000 mg | Freq: Once | INTRAMUSCULAR | Status: AC
  Administered 2014-10-20: 60 mg via INTRAMUSCULAR
  Filled 2014-10-20: qty 2

## 2014-10-20 NOTE — Progress Notes (Signed)
VASCULAR LAB PRELIMINARY  PRELIMINARY  PRELIMINARY  PRELIMINARY  Bilateral upper extremity venous duplex completed.    Preliminary report:  Follow up DVT right radial vein from October 13, 2014 reveals negative DVT bilaterally.  August Albino, RVT 10/20/2014, 11:59 AM

## 2014-10-20 NOTE — ED Provider Notes (Signed)
CSN: [id number]     Arrival date & time 10/20/14  3474 History  This chart was scribed for Tanya Hacker, MD by Rayfield Citizen, ED Scribe. This patient was seen in room A12C/A12C and the patient's care was started at 2:56 AM.    Chief Complaint  Patient presents with  . Arm Pain   Patient is a 54 y.o. female presenting with arm pain. The history is provided by the patient. No language interpreter was used.  Arm Pain Associated symptoms include headaches. Pertinent negatives include no chest pain, no abdominal pain and no shortness of breath.    HPI Comments: Tanya Escobar is a 54 y.o. female who presents to the Emergency Department complaining of left arm pain. Patient states she woke from sleep tonight to use the restroom, and while washing her hands, she noticed that her upper left arm was swollen and warm and tender to touch. Patient notes that she does sleep on that arm regularly and was sleeping on it before she woke tonight. She also reports a right-sided headache, rated 4/10, radiating down the right side of her neck. No treatments or medications tried PTA. She denies chest pain.   Patient was treated for a DVT on 10/13/14; per records, her pain began two months PTA in the back of her right calf but gradually migrated to her right arm. She states she would feel intermittent throbbing pain in her upper right arm. She was seen in the ED at that time and was found positive by venous doppler for acute occlusive DVT involving the right radial and ulnar veins. CT angio chest neg.  She was discharged home with a starter pack of Xarelto; she states she has been compliant with her discharge instructions.   Past Medical History  Diagnosis Date  . Hypertension   . Hyperlipidemia    Past Surgical History  Procedure Laterality Date  . Abdominal hysterectomy    . Breast surgery     No family history on file. History  Substance Use Topics  . Smoking status: Former Research scientist (life sciences)  . Smokeless  tobacco: Not on file  . Alcohol Use: Yes     Comment: wine occasionally   OB History    No data available     Review of Systems  Constitutional: Negative for fever.  Respiratory: Negative for chest tightness and shortness of breath.   Cardiovascular: Negative for chest pain.  Gastrointestinal: Negative for nausea, vomiting and abdominal pain.  Genitourinary: Negative for dysuria.  Musculoskeletal:       Arm pain and swelling  Skin: Negative for rash and wound.  Neurological: Positive for headaches.  Psychiatric/Behavioral: Negative for confusion.  All other systems reviewed and are negative.     Allergies  Penicillins  Home Medications   Prior to Admission medications   Medication Sig Start Date End Date Taking? Authorizing Provider  Flaxseed, Linseed, (FLAX SEED OIL) 1000 MG CAPS Take 1,000 mg by mouth daily.   Yes Historical Provider, MD  fluticasone (FLONASE) 50 MCG/ACT nasal spray Place 1 spray into both nostrils as needed for allergies or rhinitis.   Yes Historical Provider, MD  losartan-hydrochlorothiazide (HYZAAR) 50-12.5 MG per tablet Take 1 tablet by mouth daily.   Yes Historical Provider, MD  Omega-3 Fatty Acids (FISH OIL) 1000 MG CAPS Take 1 capsule by mouth daily.   Yes Historical Provider, MD  PROAIR HFA 108 (90 BASE) MCG/ACT inhaler Inhale 1 puff into the lungs every 6 (six) hours as needed for shortness  of breath.  09/30/14  Yes Historical Provider, MD  sertraline (ZOLOFT) 50 MG tablet Take 75 mg by mouth daily. 10/06/14  Yes Historical Provider, MD  simvastatin (ZOCOR) 20 MG tablet Take 20 mg by mouth every evening. 10/06/14  Yes Historical Provider, MD  valACYclovir (VALTREX) 500 MG tablet Take 500 mg by mouth daily.   Yes Historical Provider, MD  XARELTO STARTER PACK 15 & 20 MG TBPK Take 15-20 mg by mouth as directed. Take as directed on package: Start with one 15mg  tablet by mouth twice a day with food. On Day 22, switch to one 20mg  tablet once a day with food.  10/13/14  Yes Noland Fordyce, PA-C  losartan (COZAAR) 25 MG tablet Take 25 mg by mouth daily.    Historical Provider, MD   BP 134/72 mmHg  Pulse 100  Temp(Src) 97.8 F (36.6 C) (Oral)  Resp 10  Ht 5\' 3"  (1.6 m)  Wt 207 lb (93.895 kg)  BMI 36.68 kg/m2  SpO2 100% Physical Exam  Constitutional: She is oriented to person, place, and time. She appears well-developed and well-nourished.  Overweight  HENT:  Head: Normocephalic and atraumatic.  Eyes: Pupils are equal, round, and reactive to light.  Cardiovascular: Normal rate, regular rhythm and normal heart sounds.   No murmur heard. Pulmonary/Chest: Effort normal and breath sounds normal. No respiratory distress. She has no wheezes.  Abdominal: Soft. There is no tenderness.  Musculoskeletal:  Mild swelling noted to the left upper extremities on the lateral humerus, warm to touch, 2+ radial pulses, range of motion intact of the elbow and shoulder  Neurological: She is alert and oriented to person, place, and time.  Skin: Skin is warm and dry.  Psychiatric: She has a normal mood and affect.  Nursing note and vitals reviewed.   ED Course  Procedures   DIAGNOSTIC STUDIES: Oxygen Saturation is 100% on RA, normal by my interpretation.    COORDINATION OF CARE: 3:03 AM Discussed treatment plan with pt at bedside and pt agreed to plan.   Labs Review Labs Reviewed - No data to display  Imaging Review No results found.   EKG Interpretation None      MDM   Final diagnoses:  Arm pain, central, left  History of DVT (deep vein thrombosis)   Patient presents with pain and swelling of the left arm. Reports that this is new awakening earlier this morning.  Nontoxic on exam. Vital signs notable for mild tachycardia. Otherwise 100% on room air. Patient recently diagnosed with DVT and had a CT anterior chest that was negative. She is on Xarelto. Low suspicion at this time for DVT on the left given that the patient is on appropriate  therapy. However, will have patient return for ultrasound of the left upper extremity later today. She has no shortness of breath or other symptoms of PE and do not feel she needs CT imaging at this time. Patient was given Toradol for her headache. It appears that during her last presentation she also reported intermittent headaches. Suspect tension headache.  After history, exam, and medical workup I feel the patient has been appropriately medically screened and is safe for discharge home. Pertinent diagnoses were discussed with the patient. Patient was given return precautions.  I personally performed the services described in this documentation, which was scribed in my presence. The recorded information has been reviewed and is accurate.      Tanya Hacker, MD 10/20/14 [phone]

## 2014-10-20 NOTE — ED Notes (Signed)
Patient denies chest pain but states she has shortness of breath.

## 2014-10-20 NOTE — ED Notes (Addendum)
ptstates she woke up an hour ago and felt tenderness in the arm.  Noticed swelling and redness along the upper left arm area.    States that she has some tingling, throbbing in her head, also some pain in her neck.  States that the neck and arm pain has been intermittent throughout the day.  States that she had a DVT in right arm last week and is on the Xarelto starter pack.

## 2014-10-20 NOTE — Discharge Instructions (Signed)
You were seen today for pain and swelling in the left upper arm. He had a history of blood clots. You are on appropriate therapy for blood clots. However, you need to come back tomorrow to get imaging of the arm to ensure that you do not have a new clot as this might change or medication regimen. If you have any new or worsening symptoms before returning tomorrow he should be reevaluated immediately. These include chest pain, shortness of breath.

## 2014-10-30 ENCOUNTER — Ambulatory Visit: Payer: Self-pay | Admitting: Internal Medicine

## 2014-11-01 ENCOUNTER — Encounter (HOSPITAL_COMMUNITY): Payer: Self-pay | Admitting: Emergency Medicine

## 2014-11-01 DIAGNOSIS — R0602 Shortness of breath: Secondary | ICD-10-CM | POA: Diagnosis not present

## 2014-11-01 DIAGNOSIS — Z87891 Personal history of nicotine dependence: Secondary | ICD-10-CM | POA: Diagnosis not present

## 2014-11-01 DIAGNOSIS — Z79899 Other long term (current) drug therapy: Secondary | ICD-10-CM | POA: Insufficient documentation

## 2014-11-01 DIAGNOSIS — Z7901 Long term (current) use of anticoagulants: Secondary | ICD-10-CM | POA: Diagnosis not present

## 2014-11-01 DIAGNOSIS — Z880 Allergy status to penicillin: Secondary | ICD-10-CM | POA: Diagnosis not present

## 2014-11-01 DIAGNOSIS — M542 Cervicalgia: Secondary | ICD-10-CM | POA: Diagnosis not present

## 2014-11-01 DIAGNOSIS — R51 Headache: Secondary | ICD-10-CM | POA: Diagnosis not present

## 2014-11-01 DIAGNOSIS — R0789 Other chest pain: Secondary | ICD-10-CM | POA: Insufficient documentation

## 2014-11-01 DIAGNOSIS — E785 Hyperlipidemia, unspecified: Secondary | ICD-10-CM | POA: Insufficient documentation

## 2014-11-01 DIAGNOSIS — I10 Essential (primary) hypertension: Secondary | ICD-10-CM | POA: Insufficient documentation

## 2014-11-01 DIAGNOSIS — Z86718 Personal history of other venous thrombosis and embolism: Secondary | ICD-10-CM | POA: Insufficient documentation

## 2014-11-01 NOTE — ED Notes (Signed)
Patient with headache, shortness of breath and right sided neck pain.  Patient states that it started earlier in the day.  Patient also does have swelling area to left lower arm.  Patient states she has a DVT in right arm and is on Xarelto for that.  She is also having muscle pain.

## 2014-11-02 ENCOUNTER — Emergency Department (HOSPITAL_COMMUNITY)
Admission: EM | Admit: 2014-11-02 | Discharge: 2014-11-02 | Disposition: A | Discharge status: Home / Self Care | Payer: 59 | Attending: Emergency Medicine | Admitting: Emergency Medicine

## 2014-11-02 ENCOUNTER — Encounter (HOSPITAL_COMMUNITY): Payer: Self-pay | Admitting: Radiology

## 2014-11-02 ENCOUNTER — Emergency Department (HOSPITAL_COMMUNITY): Payer: 59

## 2014-11-02 DIAGNOSIS — Z86718 Personal history of other venous thrombosis and embolism: Secondary | ICD-10-CM

## 2014-11-02 DIAGNOSIS — R51 Headache: Secondary | ICD-10-CM

## 2014-11-02 DIAGNOSIS — R519 Headache, unspecified: Secondary | ICD-10-CM

## 2014-11-02 DIAGNOSIS — R079 Chest pain, unspecified: Secondary | ICD-10-CM

## 2014-11-02 DIAGNOSIS — Z7901 Long term (current) use of anticoagulants: Secondary | ICD-10-CM

## 2014-11-02 LAB — CBC WITH DIFFERENTIAL/PLATELET
BASOS PCT: 0 % (ref 0–1)
Basophils Absolute: 0 10*3/uL (ref 0.0–0.1)
Eosinophils Absolute: 0.2 10*3/uL (ref 0.0–0.7)
Eosinophils Relative: 2 % (ref 0–5)
HCT: 39.1 % (ref 36.0–46.0)
HEMOGLOBIN: 13 g/dL (ref 12.0–15.0)
Lymphocytes Relative: 41 % (ref 12–46)
Lymphs Abs: 3.1 10*3/uL (ref 0.7–4.0)
MCH: 29.1 pg (ref 26.0–34.0)
MCHC: 33.2 g/dL (ref 30.0–36.0)
MCV: 87.7 fL (ref 78.0–100.0)
Monocytes Absolute: 0.6 10*3/uL (ref 0.1–1.0)
Monocytes Relative: 8 % (ref 3–12)
NEUTROS ABS: 3.7 10*3/uL (ref 1.7–7.7)
NEUTROS PCT: 49 % (ref 43–77)
Platelets: 202 10*3/uL (ref 150–400)
RBC: 4.46 MIL/uL (ref 3.87–5.11)
RDW: 14.1 % (ref 11.5–15.5)
WBC: 7.5 10*3/uL (ref 4.0–10.5)

## 2014-11-02 LAB — BASIC METABOLIC PANEL
Anion gap: 8 (ref 5–15)
BUN: 9 mg/dL (ref 6–23)
CALCIUM: 9.4 mg/dL (ref 8.4–10.5)
CO2: 26 mmol/L (ref 19–32)
Chloride: 106 mmol/L (ref 96–112)
Creatinine, Ser: 0.74 mg/dL (ref 0.50–1.10)
GFR calc Af Amer: >90 mL/min (ref >90)
GFR calc non Af Amer: >90 mL/min (ref >90)
GLUCOSE: 92 mg/dL (ref 70–99)
Potassium: 4 mmol/L (ref 3.5–5.1)
SODIUM: 140 mmol/L (ref 135–145)

## 2014-11-02 LAB — CK: CK TOTAL: 217 U/L — AB (ref 7–177)

## 2014-11-02 MED ORDER — IOHEXOL 350 MG/ML SOLN
80.0000 mL | Freq: Once | INTRAVENOUS | Status: AC | PRN
  Administered 2014-11-02: 74 mL via INTRAVENOUS

## 2014-11-02 NOTE — ED Provider Notes (Signed)
CSN: [id number]     Arrival date & time 11/01/14  2340 History   First MD Initiated Contact with Patient 11/02/14 0050     This chart was scribed for Veryl Speak, MD by Forrestine Him, ED Scribe. This patient was seen in room B16C/B16C and the patient's care was started 12:51 AM.   Chief Complaint  Patient presents with  . Headache   Patient is a 54 y.o. female presenting with headaches. The history is provided by the patient. No language interpreter was used.  Headache Pain location:  R temporal Quality:  Unable to specify Radiates to:  Does not radiate Severity currently:  Unable to specify Severity at highest:  Unable to specify Onset quality:  Gradual Duration:  3 days Timing:  Constant Progression:  Unchanged Chronicity:  New Relieved by:  None tried Worsened by:  Nothing Ineffective treatments:  None tried Associated symptoms: neck pain   Associated symptoms: no fatigue, no nausea and no vomiting     HPI Comments: MARC SIVERTSEN is a 54 y.o. female who presents to the Emergency Department complaining of constant, ongoing R temporal HA x 3 days. She also reports R sided neck pain, SOB,  Chest tightness, L sided arm pain, along with pain to the posterior R leg. She has not tried any OTC medications or home remedies to help manage symptoms. However, pt did sleep with ice packs behind her knee last night to help with discomfort. Pt recently had a new pt appointment with Dr. Einar Gip last month. Blood work performed during visit without any abnormal findings. Next follow up scheduled for this coming week. Ms. Stmarie is currently on Xarelto for previous blood clot. Pt with known allergy to Penicillins.  Past Medical History  Diagnosis Date  . Hypertension   . Hyperlipidemia    Past Surgical History  Procedure Laterality Date  . Abdominal hysterectomy    . Breast surgery     No family history on file. History  Substance Use Topics  . Smoking status: Former Research scientist (life sciences)  . Smokeless  tobacco: Not on file  . Alcohol Use: Yes     Comment: wine occasionally   OB History    No data available     Review of Systems  Constitutional: Negative for fatigue.  Respiratory: Positive for chest tightness and shortness of breath.   Gastrointestinal: Negative for nausea and vomiting.  Musculoskeletal: Positive for neck pain.  Neurological: Positive for headaches.  All other systems reviewed and are negative.     Allergies  Penicillins  Home Medications   Prior to Admission medications   Medication Sig Start Date End Date Taking? Authorizing Provider  Flaxseed, Linseed, (FLAX SEED OIL) 1000 MG CAPS Take 1,000 mg by mouth daily.    Historical Provider, MD  fluticasone (FLONASE) 50 MCG/ACT nasal spray Place 1 spray into both nostrils as needed for allergies or rhinitis.    Historical Provider, MD  losartan (COZAAR) 25 MG tablet Take 25 mg by mouth daily.    Historical Provider, MD  losartan-hydrochlorothiazide (HYZAAR) 50-12.5 MG per tablet Take 1 tablet by mouth daily.    Historical Provider, MD  Omega-3 Fatty Acids (FISH OIL) 1000 MG CAPS Take 1 capsule by mouth daily.    Historical Provider, MD  PROAIR HFA 108 (90 BASE) MCG/ACT inhaler Inhale 1 puff into the lungs every 6 (six) hours as needed for shortness of breath.  09/30/14   Historical Provider, MD  sertraline (ZOLOFT) 50 MG tablet Take 75 mg by  mouth daily. 10/06/14   Historical Provider, MD  simvastatin (ZOCOR) 20 MG tablet Take 20 mg by mouth every evening. 10/06/14   Historical Provider, MD  valACYclovir (VALTREX) 500 MG tablet Take 500 mg by mouth daily.    Historical Provider, MD  XARELTO STARTER PACK 15 & 20 MG TBPK Take 15-20 mg by mouth as directed. Take as directed on package: Start with one 15mg  tablet by mouth twice a day with food. On Day 22, switch to one 20mg  tablet once a day with food. 10/13/14   Noland Fordyce, PA-C   Triage Vitals: BP 145/82 mmHg  Pulse 95  Temp(Src) 97.8 F (36.6 C) (Oral)  Resp 16  Ht  5\' 3"  (1.6 m)  Wt 207 lb (93.895 kg)  BMI 36.68 kg/m2  SpO2 98%   Physical Exam  Constitutional: She is oriented to person, place, and time. She appears well-developed and well-nourished. No distress.  HENT:  Head: Normocephalic and atraumatic.  Eyes: EOM are normal.  Neck: Normal range of motion.  Cardiovascular: Normal rate, regular rhythm and normal heart sounds.   Pulmonary/Chest: Effort normal and breath sounds normal.  Abdominal: Soft. She exhibits no distension. There is no tenderness.  Musculoskeletal: Normal range of motion. She exhibits tenderness. She exhibits no edema.  All extremities are grossly normal in appearance without swelling. There in tenderness in both arms, thighs, and calf's.  Neurological: She is alert and oriented to person, place, and time.  Skin: Skin is warm and dry.  Psychiatric: She has a normal mood and affect. Judgment normal.  Nursing note and vitals reviewed.   ED Course  Procedures (including critical care time)  DIAGNOSTIC STUDIES: Oxygen Saturation is 98% on RA, Normal by my interpretation.    COORDINATION OF CARE: 1:00 AM-Discussed treatment plan with pt at bedside and pt agreed to plan.     Labs Review Labs Reviewed - No data to display  Imaging Review No results found.   EKG Interpretation None      MDM   Final diagnoses:  None    Patient is a 54 year old female who presents with multiple complaints including headache, pain in her arms, pain in her legs, pain in her neck, and feeling short of breath. She tells me she was recently diagnosed with a DVT of her right upper arm and was started on Xarelto. She is concerned that she is having a blood clot moving to her lungs and wants to make sure everything is okay.  She appears quite stable and in no distress. Her vital signs are stable and there is no hypoxia or tachycardia. Workup was initiated including blood counts and electrolytes which were all essentially normal. Her total  CK was checked due to the pain in her arms and legs and does not reflect rhabdomyolysis. CT scan of the head reveals no acute process and CT of the chest is negative for acute pulmonary embolism.  I have found nothing to account for this patient's symptoms and doubt there is an emergent disease process. I have advised her that this is best worked up at the [facility] office on an outpatient basis and that there is no indication for admission.  I personally performed the services described in this documentation, which was scribed in my presence. The recorded information has been reviewed and is accurate.      Veryl Speak, MD 11/02/14 [phone]

## 2014-11-02 NOTE — Discharge Instructions (Signed)
Follow up with your primary doctor to discuss your symptoms.   Chest Wall Pain Chest wall pain is pain in or around the bones and muscles of your chest. It may take up to 6 weeks to get better. It may take longer if you must stay physically active in your work and activities.  CAUSES  Chest wall pain may happen on its own. However, it may be caused by:  A viral illness like the flu.  Injury.  Coughing.  Exercise.  Arthritis.  Fibromyalgia.  Shingles. HOME CARE INSTRUCTIONS   Avoid overtiring physical activity. Try not to strain or perform activities that cause pain. This includes any activities using your chest or your abdominal and side muscles, especially if heavy weights are used.  Put ice on the sore area.  Put ice in a plastic bag.  Place a towel between your skin and the bag.  Leave the ice on for 15-20 minutes per hour while awake for the first 2 days.  Only take over-the-counter or prescription medicines for pain, discomfort, or fever as directed by your caregiver. SEEK IMMEDIATE MEDICAL CARE IF:   Your pain increases, or you are very uncomfortable.  You have a fever.  Your chest pain becomes worse.  You have new, unexplained symptoms.  You have nausea or vomiting.  You feel sweaty or lightheaded.  You have a cough with phlegm (sputum), or you cough up blood. MAKE SURE YOU:   Understand these instructions.  Will watch your condition.  Will get help right away if you are not doing well or get worse. Document Released: 07/17/2005 Document Revised: 10/09/2011 Document Reviewed: 03/13/2011 [facility] Patient Information 2015 [place], [place]. This information is not intended to replace advice given to you by your health care provider. Make sure you discuss any questions you have with your health care provider.  General Headache Without Cause A headache is pain or discomfort felt around the head or neck area. The specific cause of a headache may not be  found. There are many causes and types of headaches. A few common ones are:  Tension headaches.  Migraine headaches.  Cluster headaches.  Chronic daily headaches. HOME CARE INSTRUCTIONS   Keep all follow-up appointments with your caregiver or any specialist referral.  Only take over-the-counter or prescription medicines for pain or discomfort as directed by your caregiver.  Lie down in a dark, quiet room when you have a headache.  Keep a headache journal to find out what may trigger your migraine headaches. For example, write down:  What you eat and drink.  How much sleep you get.  Any change to your diet or medicines.  Try massage or other relaxation techniques.  Put ice packs or heat on the head and neck. Use these 3 to 4 times per day for 15 to 20 minutes each time, or as needed.  Limit stress.  Sit up straight, and do not tense your muscles.  Quit smoking if you smoke.  Limit alcohol use.  Decrease the amount of caffeine you drink, or stop drinking caffeine.  Eat and sleep on a regular schedule.  Get 7 to 9 hours of sleep, or as recommended by your caregiver.  Keep lights dim if bright lights bother you and make your headaches worse. SEEK MEDICAL CARE IF:   You have problems with the medicines you were prescribed.  Your medicines are not working.  You have a change from the usual headache.  You have nausea or vomiting. Mack  CARE IF:   Your headache becomes severe.  You have a fever.  You have a stiff neck.  You have loss of vision.  You have muscular weakness or loss of muscle control.  You start losing your balance or have trouble walking.  You feel faint or pass out.  You have severe symptoms that are different from your first symptoms. MAKE SURE YOU:   Understand these instructions.  Will watch your condition.  Will get help right away if you are not doing well or get worse. Document Released: 07/17/2005 Document  Revised: 10/09/2011 Document Reviewed: 08/02/2011 [facility] Patient Information 2015 [place], [place]. This information is not intended to replace advice given to you by your health care provider. Make sure you discuss any questions you have with your health care provider.

## 2014-11-09 ENCOUNTER — Ambulatory Visit
Admission: RE | Admit: 2014-11-09 | Discharge: 2014-11-09 | Disposition: A | Discharge status: Home / Self Care | Payer: 59 | Source: Ambulatory Visit | Attending: Internal Medicine | Admitting: Internal Medicine

## 2014-11-09 ENCOUNTER — Other Ambulatory Visit: Payer: Self-pay | Admitting: Internal Medicine

## 2014-11-09 DIAGNOSIS — M542 Cervicalgia: Secondary | ICD-10-CM

## 2015-02-08 ENCOUNTER — Emergency Department (HOSPITAL_COMMUNITY): Payer: 59

## 2015-02-08 ENCOUNTER — Emergency Department (HOSPITAL_COMMUNITY): Admission: EM | Admit: 2015-02-08 | Discharge: 2015-02-08 | Discharge status: Absent without leave | Payer: 59

## 2015-02-08 ENCOUNTER — Emergency Department (HOSPITAL_COMMUNITY)
Admission: EM | Admit: 2015-02-08 | Discharge: 2015-02-08 | Disposition: A | Discharge status: Home / Self Care | Payer: 59 | Attending: Emergency Medicine | Admitting: Emergency Medicine

## 2015-02-08 ENCOUNTER — Encounter (HOSPITAL_COMMUNITY): Payer: Self-pay

## 2015-02-08 DIAGNOSIS — I10 Essential (primary) hypertension: Secondary | ICD-10-CM | POA: Diagnosis not present

## 2015-02-08 DIAGNOSIS — Z8639 Personal history of other endocrine, nutritional and metabolic disease: Secondary | ICD-10-CM | POA: Insufficient documentation

## 2015-02-08 DIAGNOSIS — R202 Paresthesia of skin: Secondary | ICD-10-CM | POA: Diagnosis present

## 2015-02-08 DIAGNOSIS — Z792 Long term (current) use of antibiotics: Secondary | ICD-10-CM | POA: Insufficient documentation

## 2015-02-08 DIAGNOSIS — Z79899 Other long term (current) drug therapy: Secondary | ICD-10-CM | POA: Diagnosis not present

## 2015-02-08 DIAGNOSIS — Z7901 Long term (current) use of anticoagulants: Secondary | ICD-10-CM | POA: Diagnosis not present

## 2015-02-08 DIAGNOSIS — Z7951 Long term (current) use of inhaled steroids: Secondary | ICD-10-CM | POA: Insufficient documentation

## 2015-02-08 DIAGNOSIS — Z87891 Personal history of nicotine dependence: Secondary | ICD-10-CM | POA: Diagnosis not present

## 2015-02-08 LAB — I-STAT CHEM 8, ED
BUN: 18 mg/dL (ref 6–20)
CALCIUM ION: 1.23 mmol/L (ref 1.12–1.23)
CREATININE: 0.9 mg/dL (ref 0.44–1.00)
Chloride: 104 mmol/L (ref 101–111)
GLUCOSE: 79 mg/dL (ref 65–99)
HCT: 44 % (ref 36.0–46.0)
Hemoglobin: 15 g/dL (ref 12.0–15.0)
Potassium: 3.8 mmol/L (ref 3.5–5.1)
Sodium: 138 mmol/L (ref 135–145)
TCO2: 24 mmol/L (ref 0–100)

## 2015-02-08 LAB — COMPREHENSIVE METABOLIC PANEL
ALT: 33 U/L (ref 14–54)
AST: 29 U/L (ref 15–41)
Albumin: 4.1 g/dL (ref 3.5–5.0)
Alkaline Phosphatase: 86 U/L (ref 38–126)
Anion gap: 9 (ref 5–15)
BUN: 19 mg/dL (ref 6–20)
CHLORIDE: 102 mmol/L (ref 101–111)
CO2: 26 mmol/L (ref 22–32)
Calcium: 9.2 mg/dL (ref 8.9–10.3)
Creatinine, Ser: 0.71 mg/dL (ref 0.44–1.00)
GFR calc Af Amer: >60 mL/min (ref >60)
GFR calc non Af Amer: >60 mL/min (ref >60)
GLUCOSE: 83 mg/dL (ref 65–99)
Potassium: 3.7 mmol/L (ref 3.5–5.1)
SODIUM: 137 mmol/L (ref 135–145)
Total Bilirubin: 0.5 mg/dL (ref 0.3–1.2)
Total Protein: 8.2 g/dL — ABNORMAL HIGH (ref 6.5–8.1)

## 2015-02-08 LAB — CBC
HCT: 39.7 % (ref 36.0–46.0)
Hemoglobin: 12.9 g/dL (ref 12.0–15.0)
MCH: 28.8 pg (ref 26.0–34.0)
MCHC: 32.5 g/dL (ref 30.0–36.0)
MCV: 88.6 fL (ref 78.0–100.0)
Platelets: 223 10*3/uL (ref 150–400)
RBC: 4.48 MIL/uL (ref 3.87–5.11)
RDW: 14.3 % (ref 11.5–15.5)
WBC: 8.7 10*3/uL (ref 4.0–10.5)

## 2015-02-08 LAB — DIFFERENTIAL
BASOS PCT: 0 % (ref 0–1)
Basophils Absolute: 0 10*3/uL (ref 0.0–0.1)
EOS ABS: 0.2 10*3/uL (ref 0.0–0.7)
Eosinophils Relative: 2 % (ref 0–5)
LYMPHS ABS: 2.6 10*3/uL (ref 0.7–4.0)
Lymphocytes Relative: 30 % (ref 12–46)
Monocytes Absolute: 0.6 10*3/uL (ref 0.1–1.0)
Monocytes Relative: 7 % (ref 3–12)
Neutro Abs: 5.3 10*3/uL (ref 1.7–7.7)
Neutrophils Relative %: 61 % (ref 43–77)

## 2015-02-08 LAB — APTT: APTT: 34 s (ref 24–37)

## 2015-02-08 LAB — PROTIME-INR
INR: 1.39 (ref 0.00–1.49)
Prothrombin Time: 17.2 seconds — ABNORMAL HIGH (ref 11.6–15.2)

## 2015-02-08 LAB — I-STAT TROPONIN, ED: TROPONIN I, POC: 0 ng/mL (ref 0.00–0.08)

## 2015-02-08 NOTE — ED Notes (Addendum)
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 Pt reports that she was having some shortness of breath and lightheadedness over the weekend but the symptoms have resolved. Pt reports that the only symptoms she has now are a headache, fatigue, and tingling in her right hand. Pt reports that she is currently on treatment for her DVT.

## 2015-02-08 NOTE — ED Provider Notes (Signed)
CSN: [id number]     Arrival date & time 02/08/15  1501 History   First MD Initiated Contact with Patient 02/08/15 1700     Chief Complaint  Patient presents with  . Tingling in right hand    HPI Patient presents to the emergency room with complaints of tingling in her right hand. Patient states shard and some vague generalized symptoms of malaise on Friday. She had a sharp episode of chest pain that was brief and subsequently resolved. Over the weekend she started having some diffuse myalgias and persistent fatigue. Today when she woke up she noticed tingling in her right hand. She also had a mild headache. She called her doctor to make an appointment and was told to come to the emergency room for evaluation. 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 No trouble with her balance or coordination. No trouble with her speech. No focal numbness or weakness. Past Medical History  Diagnosis Date  . Hypertension   . Hyperlipidemia    Past Surgical History  Procedure Laterality Date  . Abdominal hysterectomy    . Breast surgery     No family history on file. History  Substance Use Topics  . Smoking status: Former Research scientist (life sciences)  . Smokeless tobacco: Not on file  . Alcohol Use: Yes     Comment: wine occasionally   OB History    No data available     Review of Systems  All other systems reviewed and are negative.     Allergies  Vicodin and Penicillins  Home Medications   Prior to Admission medications   Medication Sig Start Date End Date Taking? Authorizing Provider  Flaxseed, Linseed, (FLAX SEED OIL) 1000 MG CAPS Take 1,000 mg by mouth daily.   Yes Historical Provider, MD  fluticasone (FLONASE) 50 MCG/ACT nasal spray Place 1 spray into both nostrils daily as needed for allergies or rhinitis.    Yes Historical Provider, MD  losartan-hydrochlorothiazide (HYZAAR)  50-12.5 MG per tablet Take 1 tablet by mouth daily.   Yes Historical Provider, MD  Omega-3 Fatty Acids (FISH OIL) 1000 MG CAPS Take 1 capsule by mouth daily.   Yes Historical Provider, MD  sertraline (ZOLOFT) 50 MG tablet Take 50 mg by mouth daily.  10/06/14  Yes Historical Provider, MD  traMADol (ULTRAM) 50 MG tablet TAKE 1 TABLET BY MOUTH EVERY 6 HOURS AS NEEDED TO LAST 30 DAYS 01/26/15  Yes Historical Provider, MD  valACYclovir (VALTREX) 500 MG tablet Take 500 mg by mouth daily.   Yes Historical Provider, MD  XARELTO 20 MG TABS tablet Take 20 mg by mouth daily.  01/28/15  Yes Historical Provider, MD  PROAIR HFA 108 (90 BASE) MCG/ACT inhaler Inhale 1 puff into the lungs every 6 (six) hours as needed for shortness of breath.  09/30/14   Historical Provider, MD  XARELTO STARTER PACK 15 & 20 MG TBPK Take 15-20 mg by mouth as directed. Take as directed on package: Start with one 15mg  tablet by mouth twice a day with food. On Day 22, switch to one 20mg  tablet once a day with food. Patient not taking: Reported on 02/08/2015 10/13/14   Noland Fordyce, PA-C   BP 150/123 mmHg  Pulse 74  Temp(Src) 97.9 F (36.6 C) (Oral)  Resp 17  SpO2 99% Physical Exam  Constitutional: She is oriented to person, place, and time.  She appears well-developed and well-nourished. No distress.  HENT:  Head: Normocephalic and atraumatic.  Right Ear: External ear normal.  Left Ear: External ear normal.  Mouth/Throat: Oropharynx is clear and moist.  Eyes: Conjunctivae are normal. Right eye exhibits no discharge. Left eye exhibits no discharge. No scleral icterus.  Neck: Neck supple. No tracheal deviation present.  Cardiovascular: Normal rate, regular rhythm and intact distal pulses.   Pulmonary/Chest: Effort normal and breath sounds normal. No stridor. No respiratory distress. She has no wheezes. She has no rales.  Abdominal: Soft. Bowel sounds are normal. She exhibits no distension. There is no tenderness. There is no rebound and  no guarding.  Musculoskeletal: She exhibits no edema or tenderness.  Neurological: She is alert and oriented to person, place, and time. She has normal strength. No cranial nerve deficit (No facial droop, extraocular movements intact, tongue midline ) or sensory deficit. She exhibits normal muscle tone. She displays no seizure activity. Coordination normal.  No pronator drift bilateral upper extrem, able to hold both legs off bed for 5 seconds, sensation intact in all extremities, no visual field cuts, no left or right sided neglect, normal finger-nose exam bilaterally, no nystagmus noted   Skin: Skin is warm and dry. No rash noted.  Psychiatric: She has a normal mood and affect.  Nursing note and vitals reviewed.   ED Course  Procedures (including critical care time) Labs Review Labs Reviewed  PROTIME-INR - Abnormal; Notable for the following:    Prothrombin Time 17.2 (*)    All other components within normal limits  COMPREHENSIVE METABOLIC PANEL - Abnormal; Notable for the following:    Total Protein 8.2 (*)    All other components within normal limits  APTT  CBC  DIFFERENTIAL  I-STAT CHEM 8, ED  I-STAT TROPOININ, ED    Imaging Review Ct Head Wo Contrast  02/08/2015   CLINICAL DATA:  Headache with fatigue and right hand tingling. Currently being treated for right forearm DVT. Initial encounter.  EXAM: CT HEAD WITHOUT CONTRAST  TECHNIQUE: Contiguous axial images were obtained from the base of the skull through the vertex without intravenous contrast.  COMPARISON:  Head CT 11/02/2014.  FINDINGS: There is no evidence of acute intracranial hemorrhage, mass lesion, brain edema or extra-axial fluid collection. The ventricles and subarachnoid spaces are appropriately sized for age. There is no CT evidence of acute cortical infarction. No definite abnormality is seen within the frontal horn of the right lateral ventricle.  The visualized paranasal sinuses, mastoid air cells and middle ears are  clear. The calvarium is intact.  IMPRESSION: Unremarkable noncontrast head CT. No acute intracranial findings or definite abnormality of the right frontal horn as previously questioned.   Electronically Signed   By: Richardean Sale M.D.   On: 02/08/2015 18:31     EKG Interpretation   Date/Time:  Monday February 08 2015 17:46:45 EDT Ventricular Rate:  63 PR Interval:  218 QRS Duration: 91 QT Interval:  402 QTC Calculation: 411 R Axis:   68 Text Interpretation:  Sinus rhythm Prolonged PR interval Since last  tracing rate slower Confirmed by Ayana Imhof  MD-J, Detrick Dani ([id number]) on 02/08/2015  7:21:24 PM      MDM   Final diagnoses:  Paresthesias in right hand    Pt has a normal neuro exam in the ED.  No focal weakness or numbness.  Doubt stroke or TIA.  Discussed findings with patient.  She is feeling better, reassured.  Follow up with PCP.  Warning  signs and precautions discussed   Dorie Rank, MD 02/08/15 [id number]

## 2015-02-08 NOTE — ED Notes (Signed)
Patient transported to CT 

## 2015-02-08 NOTE — Discharge Instructions (Signed)

## 2015-03-01 ENCOUNTER — Encounter (HOSPITAL_COMMUNITY): Payer: Self-pay | Admitting: Emergency Medicine

## 2015-03-01 ENCOUNTER — Emergency Department (HOSPITAL_COMMUNITY)
Admission: EM | Admit: 2015-03-01 | Discharge: 2015-03-01 | Disposition: A | Discharge status: Home / Self Care | Payer: 59 | Attending: Emergency Medicine | Admitting: Emergency Medicine

## 2015-03-01 DIAGNOSIS — M25422 Effusion, left elbow: Secondary | ICD-10-CM | POA: Insufficient documentation

## 2015-03-01 DIAGNOSIS — R51 Headache: Secondary | ICD-10-CM | POA: Diagnosis not present

## 2015-03-01 DIAGNOSIS — Z87891 Personal history of nicotine dependence: Secondary | ICD-10-CM | POA: Diagnosis not present

## 2015-03-01 DIAGNOSIS — I10 Essential (primary) hypertension: Secondary | ICD-10-CM | POA: Insufficient documentation

## 2015-03-01 DIAGNOSIS — Z7901 Long term (current) use of anticoagulants: Secondary | ICD-10-CM | POA: Diagnosis not present

## 2015-03-01 DIAGNOSIS — M79632 Pain in left forearm: Secondary | ICD-10-CM | POA: Diagnosis not present

## 2015-03-01 DIAGNOSIS — Z79899 Other long term (current) drug therapy: Secondary | ICD-10-CM | POA: Diagnosis not present

## 2015-03-01 DIAGNOSIS — Z7951 Long term (current) use of inhaled steroids: Secondary | ICD-10-CM | POA: Insufficient documentation

## 2015-03-01 DIAGNOSIS — Z8639 Personal history of other endocrine, nutritional and metabolic disease: Secondary | ICD-10-CM | POA: Diagnosis not present

## 2015-03-01 NOTE — ED Notes (Signed)
Pt alert,oriented, and  ambulatory upon DC. She was advised to go to [facility] ED registration for [place] tomorrow.

## 2015-03-01 NOTE — ED Provider Notes (Signed)
CSN: [id number]     Arrival date & time 03/01/15  0104 History   First MD Initiated Contact with Patient 03/01/15 0256     Chief Complaint  Patient presents with  . Arm Swelling  . Headache     (Consider location/radiation/quality/duration/timing/severity/associated sxs/prior Treatment) Patient is a 54 y.o. female presenting with headaches. The history is provided by the patient.  Headache She is actually complaining of pain in her left forearm and is worried that she has a blood clot there. She has a history of a blood clot in her right arm and is currently on rivaroxaban. 3 days ago, she noted some pain and swelling in her left arm in the region of her elbow. Pain is mild and she rates it at 3 sessions surgery does not recall any trauma. She is concerned that she has a new clot there. She relates that she is supposed to be seen in the anticoagulation clinic later this month, and that some tests done by her cardiologist suggested possible coagulation problems. Her complaint of headache is very minor and not the reason that she is here today.   Past Medical History  Diagnosis Date  . Hypertension   . Hyperlipidemia    Past Surgical History  Procedure Laterality Date  . Abdominal hysterectomy    . Breast surgery     History reviewed. No pertinent family history. History  Substance Use Topics  . Smoking status: Former Research scientist (life sciences)  . Smokeless tobacco: Never Used  . Alcohol Use: Yes     Comment: wine occasionally   OB History    No data available     Review of Systems  Neurological: Positive for headaches.  All other systems reviewed and are negative.     Allergies  Vicodin and Penicillins  Home Medications   Prior to Admission medications   Medication Sig Start Date End Date Taking? Authorizing Provider  acetaminophen (TYLENOL) 650 MG CR tablet Take 650 mg by mouth every 8 (eight) hours as needed for pain.   Yes Historical Provider, MD  Flaxseed, Linseed, (FLAX SEED OIL)  1000 MG CAPS Take 1,000 mg by mouth daily.   Yes Historical Provider, MD  fluticasone (FLONASE) 50 MCG/ACT nasal spray Place 1 spray into the nose daily as needed for allergies or rhinitis.    Yes Historical Provider, MD  losartan-hydrochlorothiazide (HYZAAR) 50-12.5 MG per tablet Take 1 tablet by mouth daily.   Yes Historical Provider, MD  Omega-3 Fatty Acids (FISH OIL) 1000 MG CAPS Take 1,000 mg by mouth daily.    Yes Historical Provider, MD  traMADol (ULTRAM) 50 MG tablet Take 50 mg by mouth every 6 (six) hours as needed for moderate pain.   Yes Historical Provider, MD  valACYclovir (VALTREX) 500 MG tablet Take 500 mg by mouth daily.   Yes Historical Provider, MD  vitamin B-12 (CYANOCOBALAMIN) 1000 MCG tablet Take 1,000 mcg by mouth daily.   Yes Historical Provider, MD  XARELTO 20 MG TABS tablet Take 20 mg by mouth daily.  01/28/15  Yes Historical Provider, MD  PROAIR HFA 108 (90 BASE) MCG/ACT inhaler Inhale 1 puff into the lungs every 4 (four) hours as needed for wheezing or shortness of breath.  09/30/14   Historical Provider, MD  sertraline (ZOLOFT) 50 MG tablet Take 50 mg by mouth daily.  10/06/14   Historical Provider, MD  traMADol (ULTRAM) 50 MG tablet TAKE 1 TABLET BY MOUTH EVERY 6 HOURS AS NEEDED TO LAST 30 DAYS 01/26/15   Historical  Provider, MD  XARELTO STARTER PACK 15 & 20 MG TBPK Take 15-20 mg by mouth as directed. Take as directed on package: Start with one 15mg  tablet by mouth twice a day with food. On Day 22, switch to one 20mg  tablet once a day with food. Patient not taking: Reported on 02/08/2015 10/13/14   Noland Fordyce, PA-C   BP 143/70 mmHg  Pulse 88  Temp(Src) 98.4 F (36.9 C) (Oral)  Resp 16  Ht 5\' 3"  (1.6 m)  Wt 207 lb (93.895 kg)  BMI 36.68 kg/m2  SpO2 96% Physical Exam  Nursing note and vitals reviewed.  54 year old female, resting comfortably and in no acute distress. Vital signs are  significant for borderline hypertension. Oxygen saturation is 96%, which is  normal. Head is normocephalic and atraumatic. PERRLA, EOMI. Oropharynx is clear. Neck is nontender and supple without adenopathy or JVD. Back is nontender and there is no CVA tenderness. Lungs are clear without rales, wheezes, or rhonchi. Chest is nontender. Heart has regular rate and rhythm without murmur. Abdomen is soft, flat, nontender without masses or hepatosplenomegaly and peristalsis is normoactive. Extremities have no cyanosis or edema, full range of motion is present. there is no obvious swelling of the left forearm and there is no ecchymosis seen and there is no tenderness to palpation. Full passive range of motion is present without pain. Skin is warm and dry without rash. Neurologic: Mental status is normal, cranial nerves are intact, there are no motor or sensory deficits.  ED Course  Procedures (including critical care time)  MDM   Final diagnoses:  Pain in left forearm  Current use of long term anticoagulation    pain in the left forearm and patient currently anticoagulated for DVT of the right upper extremity. Clinically, I doubt recurrent DVT and suspect that she had some minor bruising which has been exacerbated because of the anticoagulation use. However, she will be brought back for venous duplex scan to make sure she does not have a new clot. If she does, her anticoagulant will need to be adjusted.    Delora Fuel, MD 12/08/00 1117

## 2015-03-01 NOTE — ED Notes (Signed)
Pt reports L sided HA that has now moved to R side x1 week, taking Tylenol with mild relief. Pt reports LUE swelling and pain x3 days, previous hx DVT to RUE with similar symptoms, placed on Xarelto.

## 2015-03-01 NOTE — Discharge Instructions (Signed)
Return for venous duplex scan in the morning (will be done at [facility]) to see if you have a new blood clot.

## 2015-03-09 DIAGNOSIS — Z86718 Personal history of other venous thrombosis and embolism: Secondary | ICD-10-CM | POA: Insufficient documentation

## 2015-05-27 ENCOUNTER — Other Ambulatory Visit: Payer: Self-pay

## 2015-05-27 DIAGNOSIS — Z1231 Encounter for screening mammogram for malignant neoplasm of breast: Secondary | ICD-10-CM

## 2015-07-05 ENCOUNTER — Ambulatory Visit
Admission: RE | Admit: 2015-07-05 | Discharge: 2015-07-05 | Disposition: A | Discharge status: Home / Self Care | Payer: 59 | Source: Ambulatory Visit

## 2015-07-05 DIAGNOSIS — Z1231 Encounter for screening mammogram for malignant neoplasm of breast: Secondary | ICD-10-CM

## 2015-09-20 IMAGING — CT CT HEAD W/O CM
2 of 3 series · 15 of 40 positions shown, 18 images · non-contrast
Comparison: None.

CLINICAL DATA: Headache. Shortness of breath and right-sided neck
pain.

EXAM:
CT HEAD WITHOUT CONTRAST
TECHNIQUE: Contiguous axial images were obtained from the base of the skull
through the vertex without intravenous contrast.

[Series 4: head 1.0 h30s · axial · 0.41mm/px · z∈[-136,+2]mm · 12 of 152 slices shown, 15 images]
[im 7/152  brain]
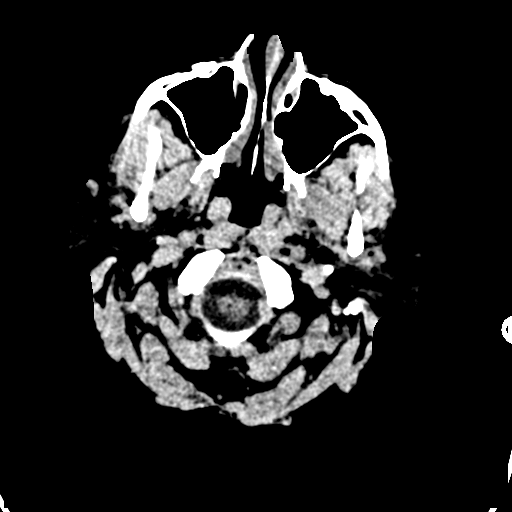
[im 7/152  bone]
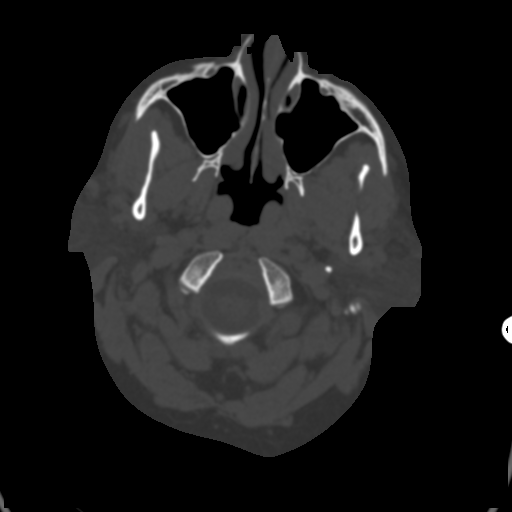
[im 19/152  brain]
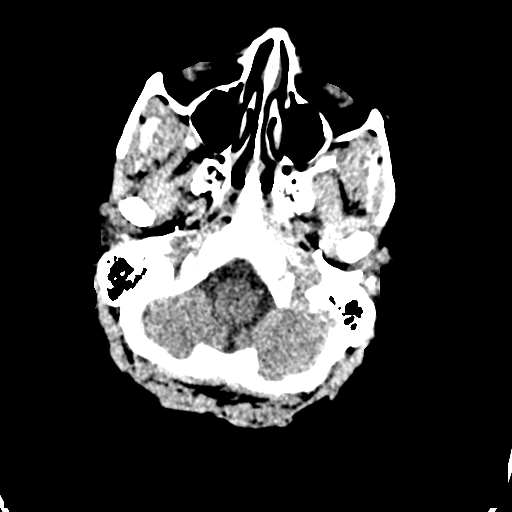
[im 32/152  brain]
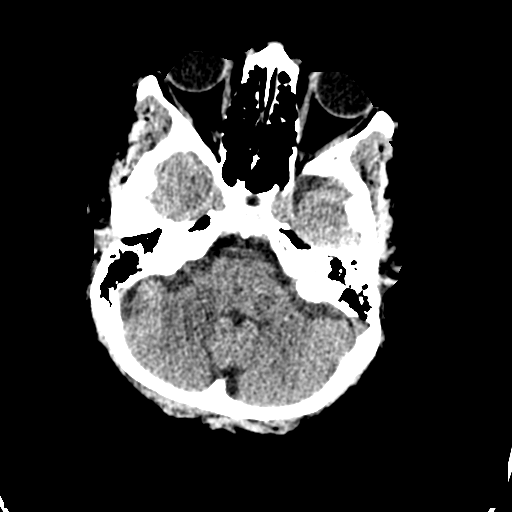
[im 45/152  brain]
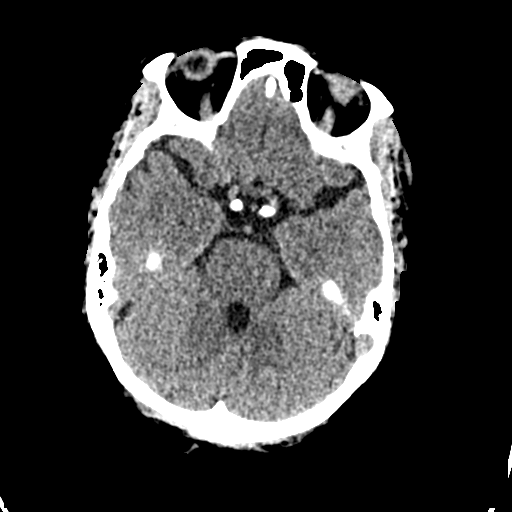
[im 57/152  brain]
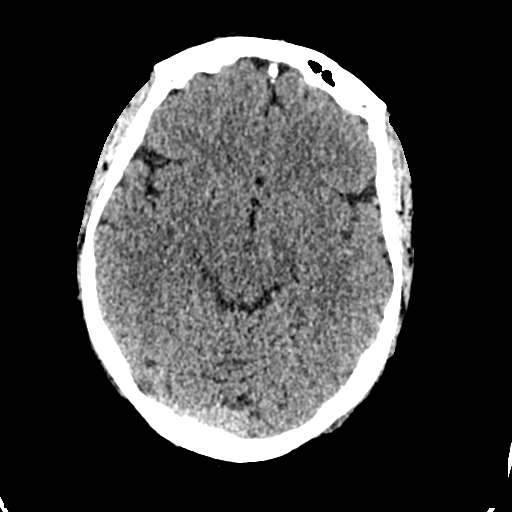
[im 57/152  bone]
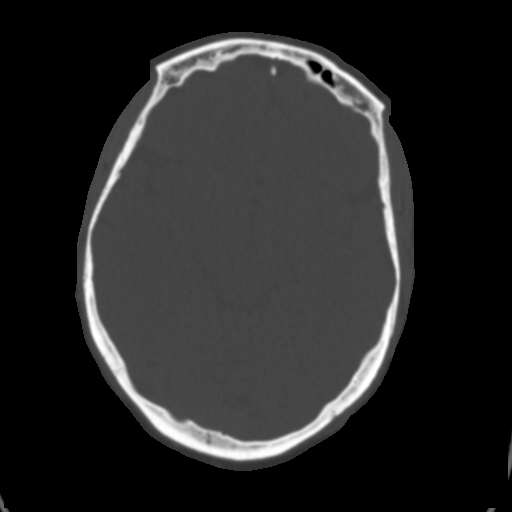
[im 70/152  brain]
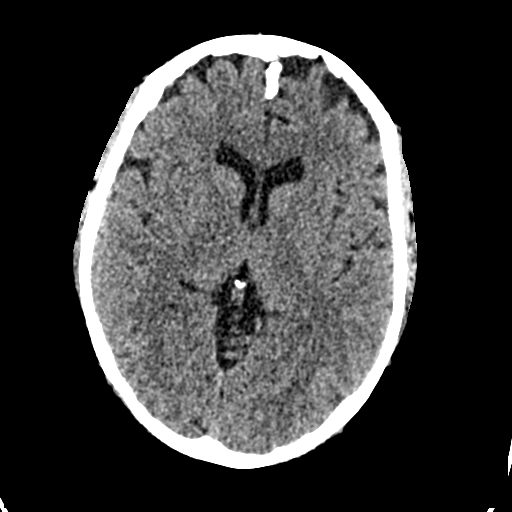
[im 82/152  brain]
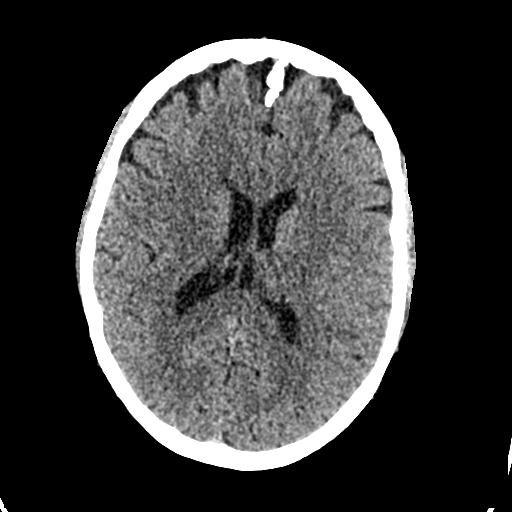
[im 95/152  brain]
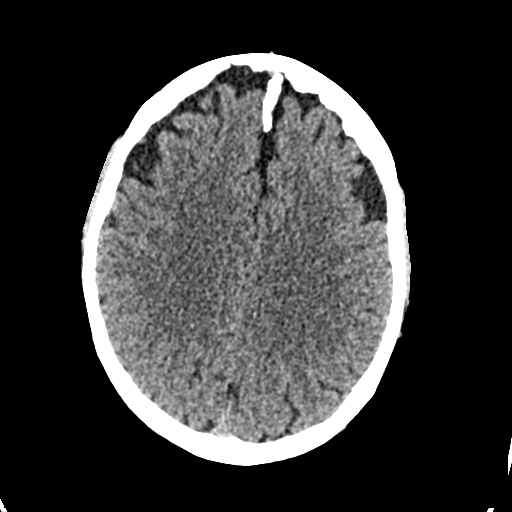
[im 107/152  brain]
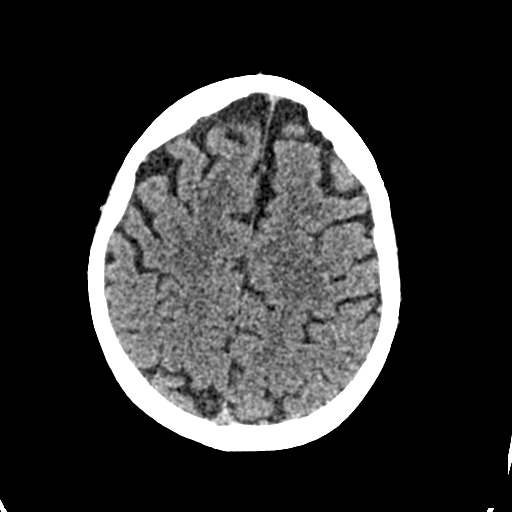
[im 107/152  bone]
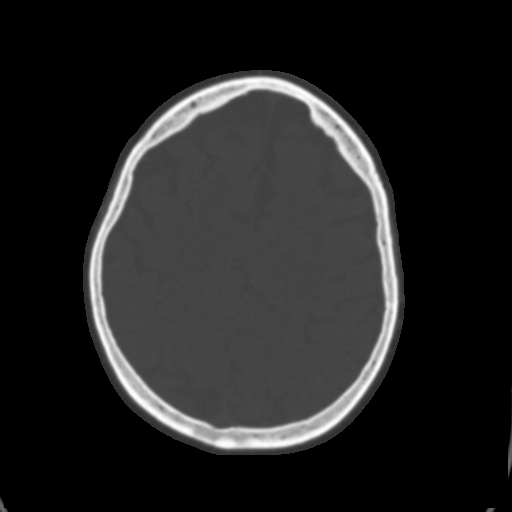
[im 120/152  brain]
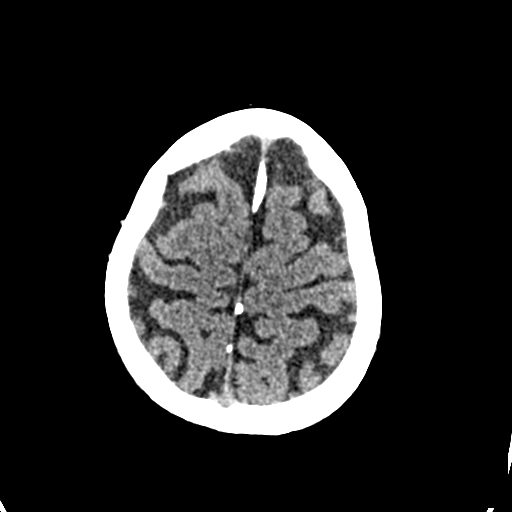
[im 133/152  brain]
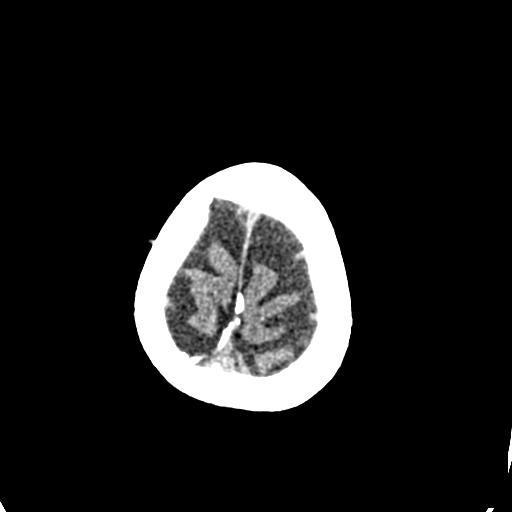
[im 145/152  brain]
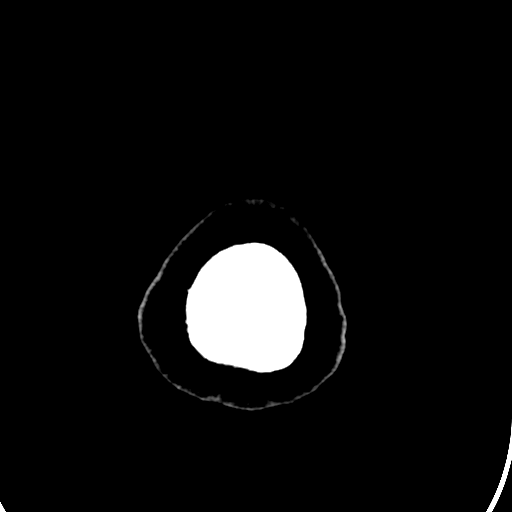

[Series 10: head 2.0 mpr · coronal · 0.30mm/px · 3 of 97 slices shown]
[im 33/97  brain]
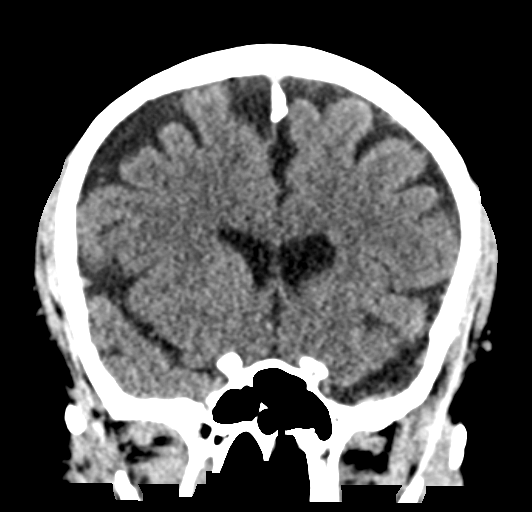
[im 43/97  brain]
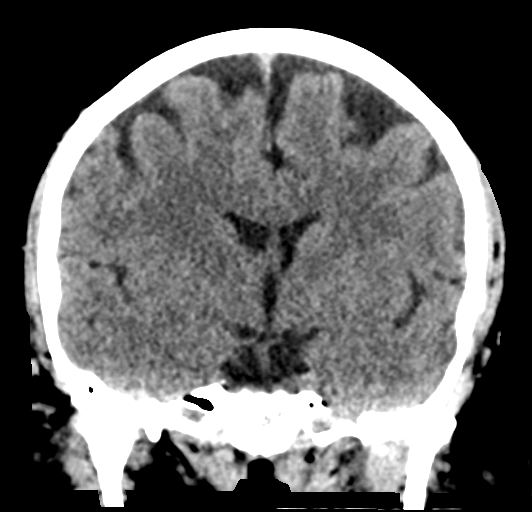
[im 54/97  brain]
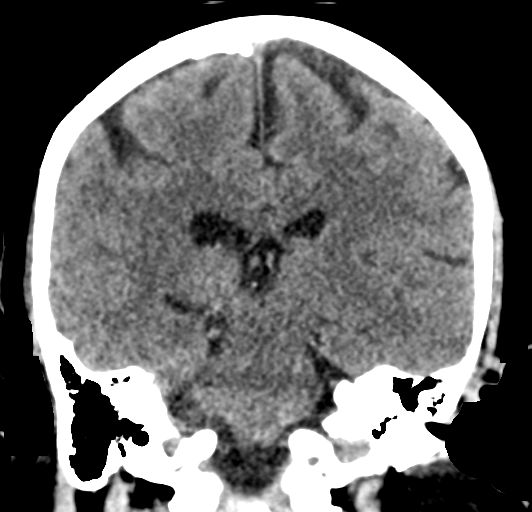

[15 of 40 positions shown; findings below may reference images not displayed]

FINDINGS: Skull and Sinuses:Negative for fracture or destructive process. The
mastoids, middle ears, and imaged paranasal sinuses are clear.

Orbits: No acute abnormality.

Brain: No evidence of acute infarction, hemorrhage, hydrocephalus,
or shift.

There is a 5 mm nodule along the frontal horn of the right lateral
ventricle which is isodense to gray matter and noncalcified.
IMPRESSION: 1. No acute intracranial findings.
2. 5 mm periventricular nodule around the right frontal horn,
appearance favoring heterotopic gray matter or less likely
subependymoma. If available, recommend correlation with prior CT or
MR imaging to document stability. If none available, would perform
six-month follow-up brain MRI with contrast.

## 2015-09-27 IMAGING — CR DG CERVICAL SPINE 2 OR 3 VIEWS
3 series · 3 of 3 positions shown · non-contrast
Comparison: None.

CLINICAL DATA: Four-week history of neck pain.  No known injury.

EXAM:
CERVICAL SPINE - 2-3 VIEW

[w c-spine lat *]
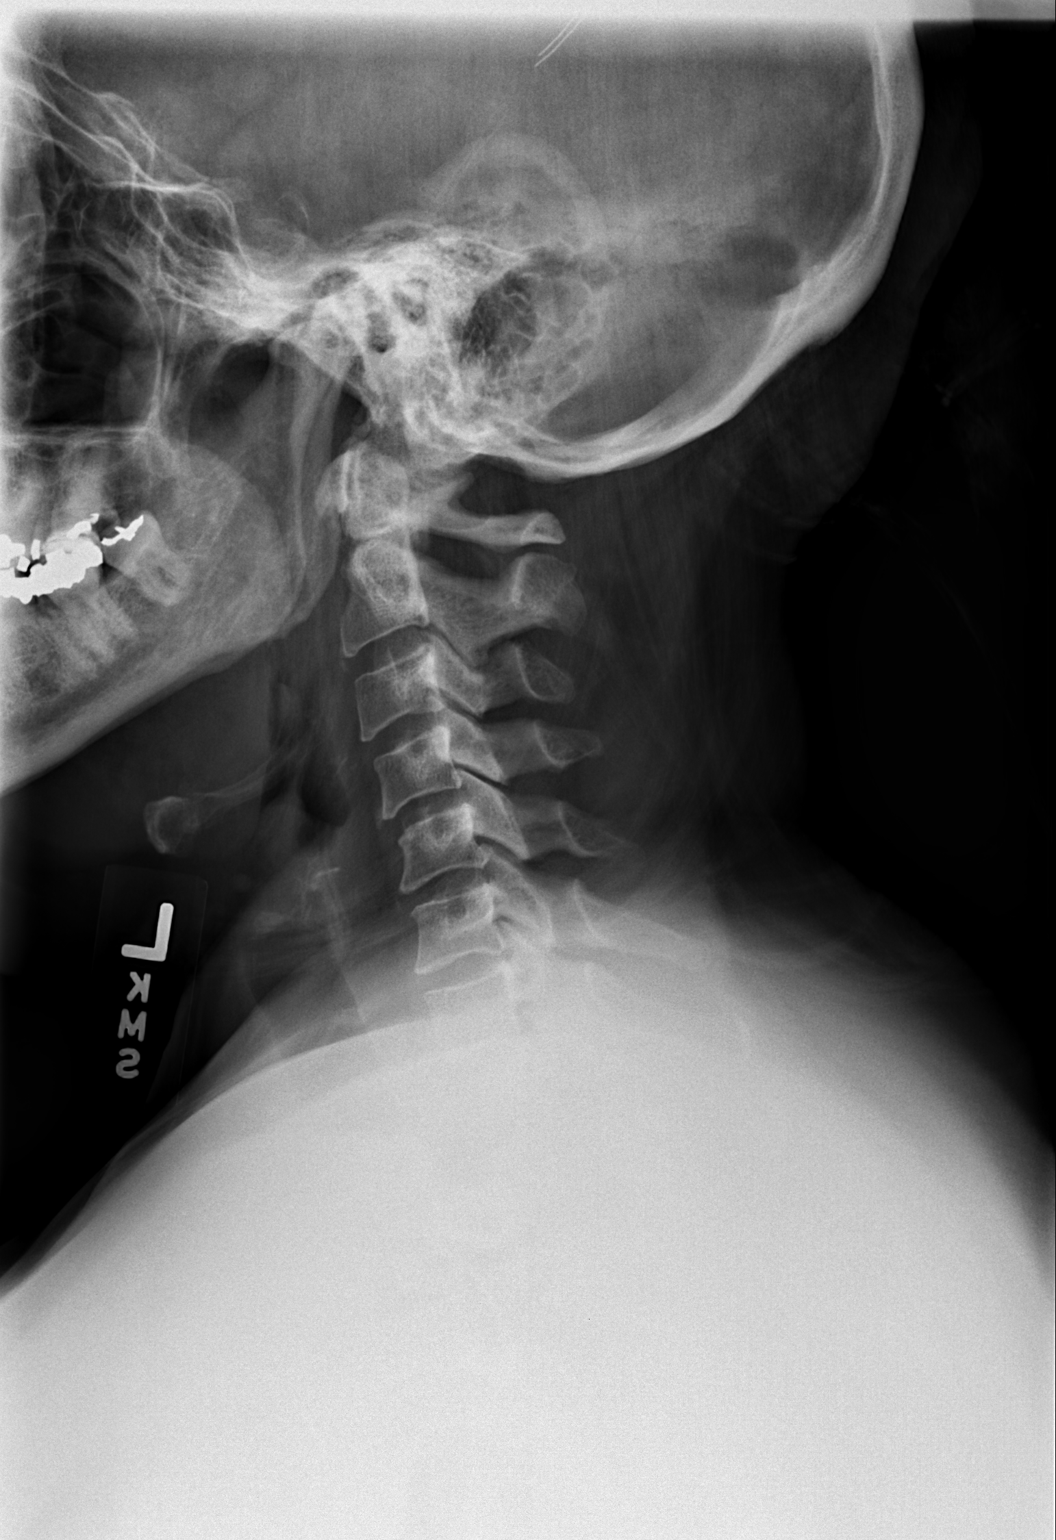

[w c-spine a.p. *]
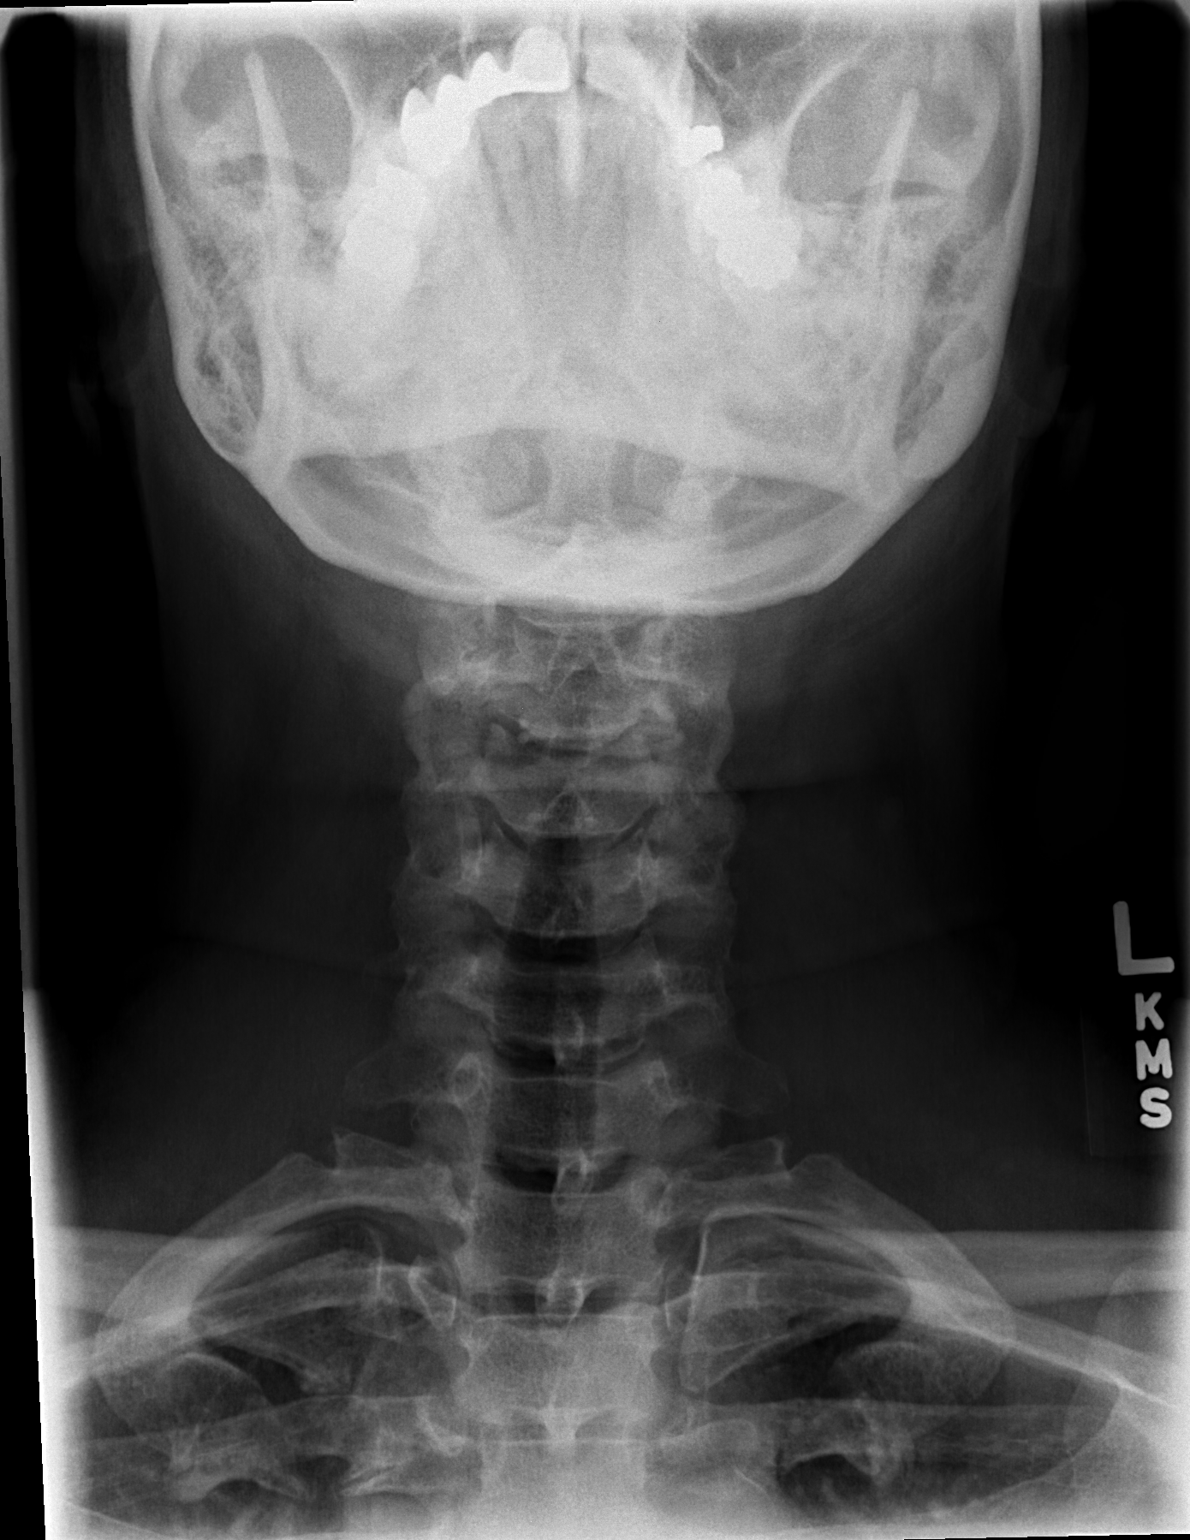

[w c-spine odontoid *]
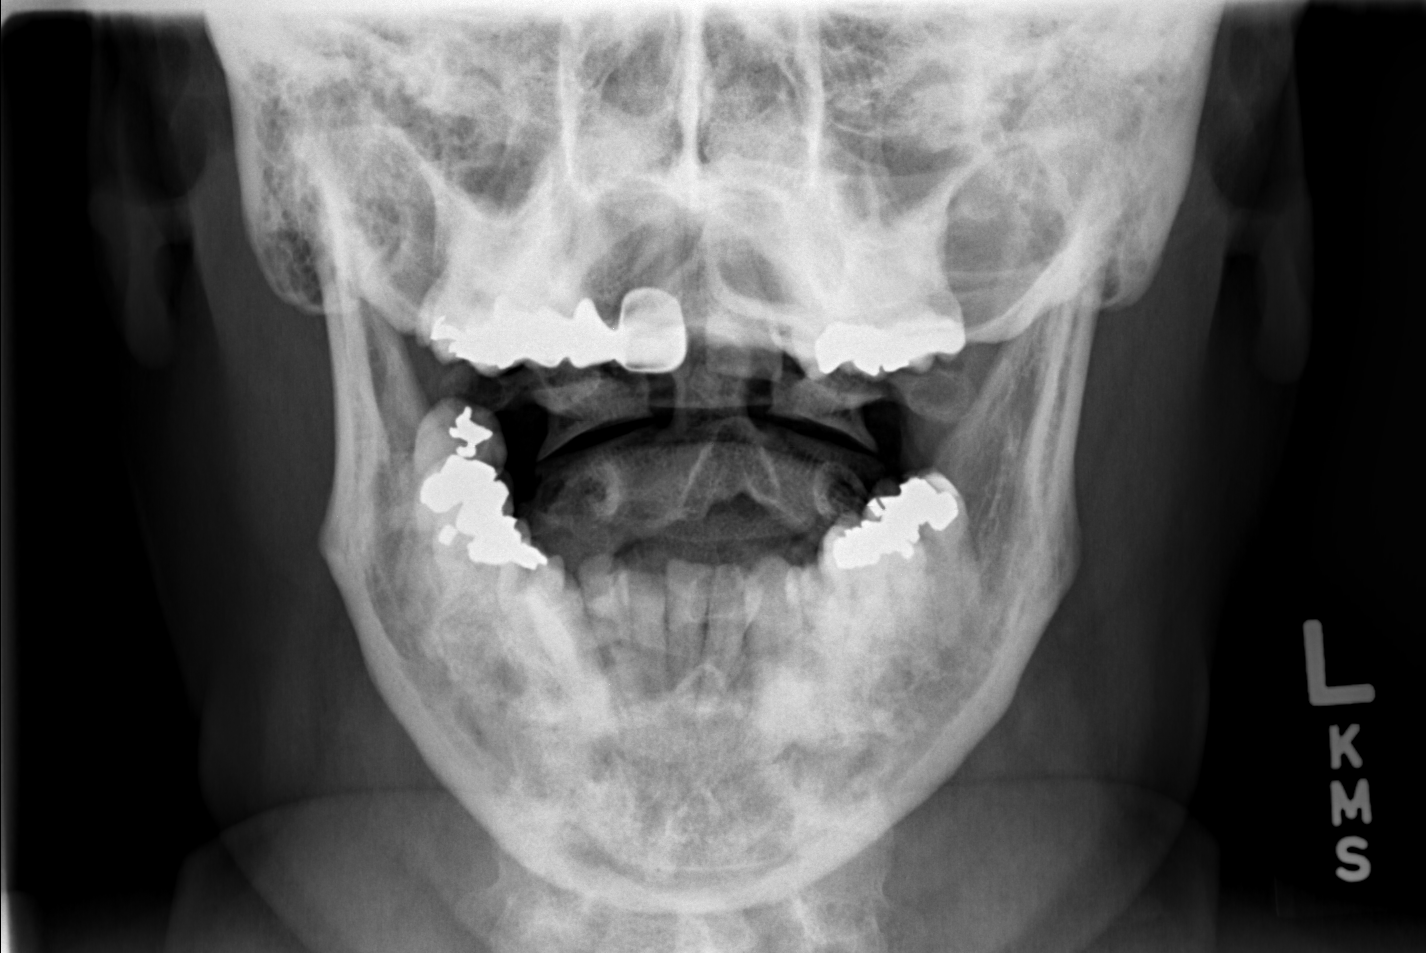

[3 of 3 positions shown; findings below may reference images not displayed]

FINDINGS: Mild straightening of the normal cervical lordosis could be due to
positioning, muscle spasm or pain. The vertebral bodies are normally
aligned. Disc spaces are maintained. No acute bony findings or
abnormal prevertebral soft tissue swelling. The C1-2 articulations
are maintained. The lung apices are clear. Small bilateral cervical
ribs are noted.
IMPRESSION: Minimal degenerative changes can no acute bony findings.

## 2016-10-27 ENCOUNTER — Emergency Department (HOSPITAL_BASED_OUTPATIENT_CLINIC_OR_DEPARTMENT_OTHER)
Admit: 2016-10-27 | Discharge: 2016-10-27 | Disposition: A | Discharge status: Home / Self Care | Payer: 59 | Attending: Emergency Medicine | Admitting: Emergency Medicine

## 2016-10-27 ENCOUNTER — Emergency Department (HOSPITAL_COMMUNITY)
Admission: EM | Admit: 2016-10-27 | Discharge: 2016-10-27 | Disposition: A | Discharge status: Home / Self Care | Payer: 59 | Attending: Emergency Medicine | Admitting: Emergency Medicine

## 2016-10-27 DIAGNOSIS — I10 Essential (primary) hypertension: Secondary | ICD-10-CM | POA: Diagnosis not present

## 2016-10-27 DIAGNOSIS — Z7902 Long term (current) use of antithrombotics/antiplatelets: Secondary | ICD-10-CM | POA: Insufficient documentation

## 2016-10-27 DIAGNOSIS — M7989 Other specified soft tissue disorders: Secondary | ICD-10-CM | POA: Diagnosis not present

## 2016-10-27 DIAGNOSIS — M79609 Pain in unspecified limb: Secondary | ICD-10-CM | POA: Diagnosis not present

## 2016-10-27 DIAGNOSIS — Z79899 Other long term (current) drug therapy: Secondary | ICD-10-CM | POA: Diagnosis not present

## 2016-10-27 DIAGNOSIS — M542 Cervicalgia: Secondary | ICD-10-CM | POA: Diagnosis present

## 2016-10-27 DIAGNOSIS — M79622 Pain in left upper arm: Secondary | ICD-10-CM | POA: Diagnosis not present

## 2016-10-27 DIAGNOSIS — Z87891 Personal history of nicotine dependence: Secondary | ICD-10-CM | POA: Diagnosis not present

## 2016-10-27 NOTE — ED Notes (Signed)
Pt taken 325 mg ASA last night and today.

## 2016-10-27 NOTE — ED Notes (Signed)
[place] tech leaving bedside.

## 2016-10-27 NOTE — ED Notes (Signed)
ED Provider at bedside for assessment.  

## 2016-10-27 NOTE — Progress Notes (Signed)
VASCULAR LAB PRELIMINARY  PRELIMINARY  PRELIMINARY  PRELIMINARY  Left upper extremity venous duplex completed.    Preliminary report:  Left :  No evidence of DVT or superficial thrombosis.    Tanya Escobar, RVS 10/27/2016, 2:47 PM

## 2016-10-27 NOTE — ED Triage Notes (Signed)
Pt complains of neck pain and stiffness x 2 days. Pt states her left arm began hurting yesterday. Pt states she is concerned she has DVT in arm. Pt has hx of DVT in right arm. Pt was taken off xeralto 1 year ago.   Pt states she also been doing some heavy lifting lately.

## 2016-10-27 NOTE — ED Notes (Signed)
Patient transported to X-ray/ At Bedside.

## 2016-10-28 NOTE — ED Provider Notes (Signed)
[facility] DEPT Provider Note   CSN: [id number] Arrival date & time: 10/27/16  1143     History   Chief Complaint Chief Complaint  Patient presents with  . Arm Pain    HPI Tanya Escobar is a 56 y.o. female.  HPI Patient presents to the emergency department complaining of mild left neck pain with radiation down her left arm.  This morning she awoke and felt as though her left upper extremity was slightly swollen.  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  She is no longer on anticoagulation.  She has no chest pain shortness of breath.  She has done some recent lifting and cannot remember if she injured her neck at all.  She denies weakness in her upper extremities.  No fevers or chills.  No chest pain or shortness of breath.  No cough.  No numbness or tingling in her left upper extremity.   Past Medical History:  Diagnosis Date  . Hyperlipidemia   . Hypertension     There are no active problems to display for this patient.   Past Surgical History:  Procedure Laterality Date  . ABDOMINAL HYSTERECTOMY    . BREAST SURGERY      OB History    No data available       Home Medications    Prior to Admission medications   Medication Sig Start Date End Date Taking? Authorizing Provider  acetaminophen (TYLENOL) 650 MG CR tablet Take 650 mg by mouth every 8 (eight) hours as needed for pain.    Historical Provider, MD  Flaxseed, Linseed, (FLAX SEED OIL) 1000 MG CAPS Take 1,000 mg by mouth daily.    Historical Provider, MD  fluticasone (FLONASE) 50 MCG/ACT nasal spray Place 1 spray into the nose daily as needed for allergies or rhinitis.     Historical Provider, MD  losartan-hydrochlorothiazide (HYZAAR) 50-12.5 MG per tablet Take 1 tablet by mouth daily.    Historical Provider, MD  Omega-3 Fatty Acids (FISH OIL) 1000 MG CAPS Take 1,000 mg by mouth daily.     Historical Provider, MD  PROAIR HFA 108 (90 BASE) MCG/ACT inhaler Inhale 1 puff into the lungs every 4  (four) hours as needed for wheezing or shortness of breath.  09/30/14   Historical Provider, MD  sertraline (ZOLOFT) 50 MG tablet Take 50 mg by mouth daily.  10/06/14   Historical Provider, MD  traMADol (ULTRAM) 50 MG tablet TAKE 1 TABLET BY MOUTH EVERY 6 HOURS AS NEEDED TO LAST 30 DAYS 01/26/15   Historical Provider, MD  traMADol (ULTRAM) 50 MG tablet Take 50 mg by mouth every 6 (six) hours as needed for moderate pain.    Historical Provider, MD  valACYclovir (VALTREX) 500 MG tablet Take 500 mg by mouth daily.    Historical Provider, MD  vitamin B-12 (CYANOCOBALAMIN) 1000 MCG tablet Take 1,000 mcg by mouth daily.    Historical Provider, MD  XARELTO 20 MG TABS tablet Take 20 mg by mouth daily.  01/28/15   Historical Provider, MD  XARELTO STARTER PACK 15 & 20 MG TBPK Take 15-20 mg by mouth as directed. Take as directed on package: Start with one 15mg  tablet by mouth twice a day with food. On Day 22, switch to one 20mg  tablet once a day with food. Patient not taking: Reported on 02/08/2015 10/13/14   Noland Fordyce, PA-C    Family History No family history on file.  Social History Social History  Substance Use  Topics  . Smoking status: Former Research scientist (life sciences)  . Smokeless tobacco: Never Used  . Alcohol use Yes     Comment: wine occasionally     Allergies   Vicodin [hydrocodone-acetaminophen] and Penicillins   Review of Systems Review of Systems  All other systems reviewed and are negative.    Physical Exam Updated Vital Signs BP 128/73   Pulse 68   Temp 98.1 F (36.7 C) (Oral)   Resp 16   SpO2 100%   Physical Exam  Constitutional: She is oriented to person, place, and time. She appears well-developed and well-nourished. No distress.  HENT:  Head: Normocephalic and atraumatic.  Eyes: EOM are normal.  Neck: Normal range of motion. Neck supple.  No point tenderness of the cervical spine.  Mild paracervical tenderness on the left without spasm.  Mild tenderness left trapezius region.  No  tenderness of left medial upper arm  Cardiovascular: Normal rate and regular rhythm.   Pulmonary/Chest: Effort normal and breath sounds normal.  Abdominal: Soft. She exhibits no distension. There is no tenderness.  Musculoskeletal:  Full range of motion bilateral wrists, elbows, shoulders.  Normal left radial pulse.  Normal grip strength left hand.  No significant swelling of the left upper extreme is compared to the right.  Neurological: She is alert and oriented to person, place, and time.  Skin: Skin is warm and dry.  Psychiatric: She has a normal mood and affect. Judgment normal.  Nursing note and vitals reviewed.    ED Treatments / Results  Labs (all labs ordered are listed, but only abnormal results are displayed) Labs Reviewed - No data to display  EKG  EKG Interpretation None       Radiology No results found.  Procedures Procedures (including critical care time)  Medications Ordered in ED Medications - No data to display   Initial Impression / Assessment and Plan / ED Course  I have reviewed the triage vital signs and the nursing notes.  Pertinent labs & imaging results that were available during my care of the patient were reviewed by me and considered in my medical decision making (see chart for details).     Vascular duplex negative for DVT of the left upper extreme.  She has normal arterial flow.  No signs of cellulitis or other deep space infection.  No weakness the left upper extremity.  This may represent cervical radiculopathy.  Primary care follow-up.  She understands return to the ER for new or worsening symptoms.  Final Clinical Impressions(s) / ED Diagnoses   Final diagnoses:  Left upper arm pain    New Prescriptions Discharge Medication List as of 10/27/2016  3:11 PM       Jola Schmidt, MD 10/28/16 1001

## 2017-02-28 ENCOUNTER — Other Ambulatory Visit: Payer: Self-pay | Admitting: Internal Medicine

## 2017-02-28 ENCOUNTER — Ambulatory Visit
Admission: RE | Admit: 2017-02-28 | Discharge: 2017-02-28 | Disposition: A | Discharge status: Home / Self Care | Payer: 59 | Source: Ambulatory Visit | Attending: Internal Medicine | Admitting: Internal Medicine

## 2017-02-28 DIAGNOSIS — M5431 Sciatica, right side: Secondary | ICD-10-CM

## 2017-05-07 ENCOUNTER — Encounter: Payer: Self-pay | Admitting: Gastroenterology

## 2017-05-07 ENCOUNTER — Other Ambulatory Visit: Payer: Self-pay | Admitting: Internal Medicine

## 2017-05-07 DIAGNOSIS — Z1231 Encounter for screening mammogram for malignant neoplasm of breast: Secondary | ICD-10-CM

## 2017-05-23 ENCOUNTER — Ambulatory Visit
Admission: RE | Admit: 2017-05-23 | Discharge: 2017-05-23 | Disposition: A | Discharge status: Home / Self Care | Payer: 59 | Source: Ambulatory Visit | Attending: Internal Medicine | Admitting: Internal Medicine

## 2017-05-23 DIAGNOSIS — Z1231 Encounter for screening mammogram for malignant neoplasm of breast: Secondary | ICD-10-CM

## 2017-05-31 ENCOUNTER — Encounter: Payer: 59 | Attending: Internal Medicine | Admitting: Dietician

## 2017-05-31 ENCOUNTER — Encounter: Payer: Self-pay | Admitting: Dietician

## 2017-05-31 DIAGNOSIS — E669 Obesity, unspecified: Secondary | ICD-10-CM

## 2017-05-31 DIAGNOSIS — Z6838 Body mass index (BMI) 38.0-38.9, adult: Secondary | ICD-10-CM

## 2017-05-31 DIAGNOSIS — Z713 Dietary counseling and surveillance: Secondary | ICD-10-CM | POA: Insufficient documentation

## 2017-05-31 NOTE — Patient Instructions (Signed)
Stay active.  Start slow and increase as tolerated.   Continue the vitamin D daily Consider the Vitamin B-12- sublingual or dissolvable  Be mindful  Eat slowly  Stop when you are satisfied.  Listen to your body. Choose more plants Lean meats Reconsider your bed time snack.

## 2017-05-31 NOTE — Progress Notes (Signed)
Medical Nutrition Therapy:  Appt start time: 9381 end time:  1630.   Assessment:  Primary concerns today: Patient is here today alone.  She was referred for obesity.  She states that since she was a teen her weight has been a roller coaster.  She states that she has not been diligent or motivated with exercise. She feels that changing her eating and weight loss would help her with ability to exercise.  She states that she gets very fatigued after 5 minutes on a treadmill.  She also notes that she is dizzy when she stoops and changes positions quickly. Labs include vitamin D 14.3, A1C 5.7%, Cholesterol 228, Triglycerides 129, HDL 67, LDL 135 (05/07/17)  Weight Hx: Highest adult weight 213 lbs 2 weeks ago Today 211 lbs Lowest adult weight 121 lbs in 20's and 137 lbs in the past 20 years. She did Weight Watcher's in the past and lost weight.  She has thought about using this again.  She also did [place] diet and lost to 168 lbs in 2012. Weight returns when she resumes her usual eating habits and stops exercising. 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  Patient lives with her mother and is her primary caregiver.  She works for [employer] and travels 3 weeks out of the month.  Patient loves to cook and eats out.  She enjoys increased vegetables.  She is currently working on eating more healthfully.  She also loves to bake but has cut back on this.  Preferred Learning Style:   No preference indicated   Learning Readiness:   Ready   MEDICATIONS: see list to also include Coconut oil capsules.  She was taking Vitamin B-12 for a deficiency in the past and just started vitamin D.   DIETARY INTAKE:  Usual eating pattern includes 2 meals and 1-3 snacks per day.  Everyday foods include eating out.     24-hr recall:  B ( AM): fruit, unsweetened tea, latte, today OR whole avocado, salsa, chips OR oatmeal OR scrambled egg whites, bacon, grapefruit Snk ( AM): none unless fruit  L (  PM): apple Snk ( PM): none unless fruit, nuts or pretzels or tortilla chips with salsa D ( PM): salad with ground meat and cheese, sour cream OR Spaghetti with meat sauce OR fried liver, legumes, sweet potato, vegetables OR hamburger and fries (fast food) OR thin crust pizza with veges Snk ( PM): popcorn or cookies or cake or ice cream or milkshake Beverages: sparkling water, water, regular gingerale, cheerwine, Mt. Dew (only when eating out)  Usual physical activity: Has a gym membership and used to go at 6 am and "loved it" but has not gone recently.   Progress Towards Goal(s):  In progress.   Nutritional Diagnosis:  NB-1.1 Food and nutrition-related knowledge deficit As related to lifestlye change regarding healthy eating.  As evidenced by patient report and diet hx.    Intervention:  Nutrition education/counseling regarding healthy choices when eating out.  Healthy cooking options and basic meal planning.  Discussed exercise and to begin to start little bits in day to day life.  Stay active.  Start slow and increase as tolerated.   Continue the vitamin D daily Consider the Vitamin B-12- sublingual or dissolvable  Be mindful  Eat slowly  Stop when you are satisfied.  Listen to your body. Choose more plants Lean meats Reconsider your bed time snack.     Teaching Method Utilized:  Visual Auditory Hands on  Handouts  given during visit include:  Meal plan card  Label reading  Snack list  Barriers to learning/adherence to lifestyle change: none  Demonstrated degree of understanding via:  Teach Back   Monitoring/Evaluation:  Dietary intake, exercise, label reading, and body weight in 1 month(s).

## 2017-06-25 ENCOUNTER — Ambulatory Visit: Payer: 59 | Admitting: Dietician

## 2017-07-02 ENCOUNTER — Ambulatory Visit (AMBULATORY_SURGERY_CENTER): Payer: Self-pay | Admitting: *Deleted

## 2017-07-02 ENCOUNTER — Other Ambulatory Visit: Payer: Self-pay

## 2017-07-02 VITALS — Ht 62.0 in | Wt 214.0 lb

## 2017-07-02 DIAGNOSIS — Z800 Family history of malignant neoplasm of digestive organs: Secondary | ICD-10-CM

## 2017-07-02 MED ORDER — NA SULFATE-K SULFATE-MG SULF 17.5-3.13-1.6 GM/177ML PO SOLN: ORAL | 0 refills | Status: DC

## 2017-07-02 NOTE — Progress Notes (Signed)
Patient denies any allergies to eggs or soy. Patient does have reaction of swelling when given the "flu shot" per pt.  Patient states that during [facility] patient was told she had complication with "soft palate"used, patient does not know what complication she had. Patient however states she had hysterectomy after that and had no complications. Patient denies any problems with sedation. Patient denies any oxygen use at home. Patient denies taking any diet/weight loss medications or blood thinners. EMMI education assisgned to patient on colonoscopy, this was explained and instructions given to patient.

## 2017-07-20 ENCOUNTER — Encounter: Payer: 59 | Admitting: Gastroenterology

## 2017-08-09 ENCOUNTER — Encounter: Payer: 59 | Admitting: Gastroenterology

## 2017-12-27 ENCOUNTER — Ambulatory Visit: Payer: Self-pay | Admitting: Podiatry

## 2017-12-31 ENCOUNTER — Ambulatory Visit: Payer: 59 | Admitting: Podiatry

## 2017-12-31 ENCOUNTER — Ambulatory Visit (INDEPENDENT_AMBULATORY_CARE_PROVIDER_SITE_OTHER): Payer: 59

## 2017-12-31 ENCOUNTER — Encounter: Payer: Self-pay | Admitting: Podiatry

## 2017-12-31 VITALS — BP 123/69 | HR 63 | Resp 16

## 2017-12-31 DIAGNOSIS — M2010 Hallux valgus (acquired), unspecified foot: Secondary | ICD-10-CM | POA: Diagnosis not present

## 2017-12-31 DIAGNOSIS — M2012 Hallux valgus (acquired), left foot: Secondary | ICD-10-CM | POA: Diagnosis not present

## 2017-12-31 DIAGNOSIS — M2021 Hallux rigidus, right foot: Secondary | ICD-10-CM

## 2017-12-31 DIAGNOSIS — M2011 Hallux valgus (acquired), right foot: Secondary | ICD-10-CM | POA: Diagnosis not present

## 2017-12-31 NOTE — Patient Instructions (Signed)

## 2017-12-31 NOTE — Progress Notes (Signed)
   Subjective:    Patient ID: Tanya Escobar, female    DOB: Dec 28, 1960, 57 y.o.   MRN: [id number]  HPI    Review of Systems  All other systems reviewed and are negative.      Objective:   Physical Exam        Assessment & Plan:

## 2018-01-02 NOTE — Progress Notes (Signed)
Subjective:   Patient ID: Tanya Escobar, female   DOB: 57 y.o.   MRN: [id number]   HPI Patient presents stating of had a lot of pain in my big toe joint and its bothering me for a while and I tried wider shoes I tried to soak it and it continues to get me trouble   Review of Systems  All other systems reviewed and are negative.       Objective:  Physical Exam  Constitutional: She appears well-developed and well-nourished.  Cardiovascular: Intact distal pulses.  Pulmonary/Chest: Effort normal.  Musculoskeletal: Normal range of motion.  Neurological: She is alert.  Skin: Skin is warm.  Nursing note and vitals reviewed.   Neurovascular status intact muscle strength was adequate range of motion within normal limits with patient noted to have hyperostosis medial dorsal aspect of the right first metatarsal with significant range of motion loss and pain within the joint surface with palpation.  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  I do think that ultimately this will require surgery and I reviewed that with her and she wants to get it done and I recommended removal of bone spur with possibility for osteotomy or we may just be able to remove the spur and regain her motion.  Patient will be seen back for [place] to recheck again in 6 weeks and is tentatively scheduled for surgery in approximately 2 months  X-ray indicates on the right foot spur formation dorsal medial aspect first metatarsal with mild limitation and mild reduction of the joint space with the left showing minimal spur formation and no other indications of pathology

## 2018-02-11 ENCOUNTER — Ambulatory Visit: Payer: 59 | Admitting: Podiatry

## 2018-02-14 ENCOUNTER — Ambulatory Visit (INDEPENDENT_AMBULATORY_CARE_PROVIDER_SITE_OTHER): Payer: 59 | Admitting: Podiatry

## 2018-02-14 ENCOUNTER — Encounter: Payer: Self-pay | Admitting: Podiatry

## 2018-02-14 DIAGNOSIS — M2021 Hallux rigidus, right foot: Secondary | ICD-10-CM

## 2018-02-14 NOTE — Patient Instructions (Signed)
Pre-Operative Instructions  Congratulations, you have decided to take an important step towards improving your quality of life.  You can be assured that the doctors and staff at [facility] will be with you every step of the way.  Here are some important things you should know:  1. Plan to be at the surgery center/hospital at least 1 (one) hour prior to your scheduled time, unless otherwise directed by the surgical center/hospital staff.  You must have a responsible adult accompany you, remain during the surgery and drive you home.  Make sure you have directions to the surgical center/hospital to ensure you arrive on time. 2. If you are having surgery at [facility] or [facility] hospitals, you will need a copy of your medical history and physical form from your family physician within one month prior to the date of surgery. We will give you a form for your primary physician to complete.  3. We make every effort to accommodate the date you request for surgery.  However, there are times where surgery dates or times have to be moved.  We will contact you as soon as possible if a change in schedule is required.   4. No aspirin/ibuprofen for one week before surgery.  If you are on aspirin, any non-steroidal anti-inflammatory medications (Mobic, Aleve, Ibuprofen) should not be taken seven (7) days prior to your surgery.  You make take Tylenol for pain prior to surgery.  5. Medications - If you are taking daily heart and blood pressure medications, seizure, reflux, allergy, asthma, anxiety, pain or diabetes medications, make sure you notify the surgery center/hospital before the day of surgery so they can tell you which medications you should take or avoid the day of surgery. 6. No food or drink after midnight the night before surgery unless directed otherwise by surgical center/hospital staff. 7. No alcoholic beverages 24-hours prior to surgery.  No smoking 24-hours prior or 24-hours after  surgery. 8. Wear loose pants or shorts. They should be loose enough to fit over bandages, boots, and casts. 9. Don't wear slip-on shoes. Sneakers are preferred. 10. Bring your boot with you to the surgery center/hospital.  Also bring crutches or a walker if your physician has prescribed it for you.  If you do not have this equipment, it will be provided for you after surgery. 11. If you have not been contacted by the surgery center/hospital by the day before your surgery, call to confirm the date and time of your surgery. 12. Leave-time from work may vary depending on the type of surgery you have.  Appropriate arrangements should be made prior to surgery with your employer. 13. Prescriptions will be provided immediately following surgery by your doctor.  Fill these as soon as possible after surgery and take the medication as directed. Pain medications will not be refilled on weekends and must be approved by the doctor. 14. Remove nail polish on the operative foot and avoid getting pedicures prior to surgery. 15. Wash the night before surgery.  The night before surgery wash the foot and leg well with water and the antibacterial soap provided. Be sure to pay special attention to beneath the toenails and in between the toes.  Wash for at least three (3) minutes. Rinse thoroughly with water and dry well with a towel.  Perform this wash unless told not to do so by your physician.  Enclosed: 1 Ice pack (please put in freezer the night before surgery)   1 Hibiclens skin cleaner     Pre-op instructions  If you have any questions regarding the instructions, please do not hesitate to call our office.  [place]: [address], [place], [place] [address] -- [phone]  [place]: [address] [place], [place] [address] -- [phone]  [place]: [address]  [place], [place] [address] -- [phone]  [place]: [address], [address], [place], [place] [address] -- [phone]  Website:  [email or web address] 

## 2018-02-14 NOTE — Progress Notes (Signed)
Subjective:   Patient ID: Tanya Escobar, female   DOB: 57 y.o.   MRN: [id number]   HPI Patient presents stating I am ready for surgery on my right foot and it has been very sore and making shoe gear increasingly difficult   ROS      Objective:  Physical Exam  Neurovascular status intact with patient having a prominence of the first metatarsal head right with reduced range of motion of the joint surface and mild crepitus     Assessment:  Bone spur formation of the right first metatarsal with hallux limitus rigidus condition     Plan:  Reviewed condition and at this point discussed surgical intervention in this case.  I do think spur removal with possible subchondral bone drilling and possible osteotomy will be necessary and I explained procedure to the patient.  I explained there is no guarantee with but to regain motion and possibility exist ultimately may require fusion or implantation procedure.  Patient understands all this wants surgery and after extensive review signed consent form.  I did dispense air fracture walker with all instructions on usage and patient is scheduled for outpatient surgery.  Patient understands total recovery will take 6 months to 1 year and is encouraged to call with any questions prior to procedure

## 2018-02-26 ENCOUNTER — Encounter: Payer: Self-pay | Admitting: Podiatry

## 2018-02-26 ENCOUNTER — Telehealth: Payer: Self-pay | Admitting: *Deleted

## 2018-02-26 DIAGNOSIS — M2011 Hallux valgus (acquired), right foot: Secondary | ICD-10-CM

## 2018-02-26 DIAGNOSIS — M2021 Hallux rigidus, right foot: Secondary | ICD-10-CM

## 2018-02-26 NOTE — Telephone Encounter (Signed)
Tanya Escobar - [facility] called for clarification of the phenergan instructions and I informed the phenergan was to be taken prior to the demerol.

## 2018-03-06 ENCOUNTER — Ambulatory Visit (INDEPENDENT_AMBULATORY_CARE_PROVIDER_SITE_OTHER): Payer: 59 | Admitting: Podiatry

## 2018-03-06 ENCOUNTER — Ambulatory Visit (INDEPENDENT_AMBULATORY_CARE_PROVIDER_SITE_OTHER): Payer: 59

## 2018-03-06 VITALS — BP 141/80 | HR 76 | Temp 97.0°F

## 2018-03-06 DIAGNOSIS — M2021 Hallux rigidus, right foot: Secondary | ICD-10-CM | POA: Diagnosis not present

## 2018-03-06 DIAGNOSIS — M2010 Hallux valgus (acquired), unspecified foot: Secondary | ICD-10-CM | POA: Diagnosis not present

## 2018-03-06 NOTE — Progress Notes (Signed)
Subjective:   Patient ID: Tanya Escobar, female   DOB: 57 y.o.   MRN: [id number]   HPI Patient presents stating overall I am doing a lot better with minimal discomfort and I have been able to bend the toe joint a little bit   ROS      Objective:  Physical Exam  Neurovascular status intact negative Homans sign was noted with wound edges well coapted first MPJ right with good range of motion no crepitus of the joint     Assessment:  Doing well post osteotomy first metatarsal right     Plan:  H&P condition reviewed and recommended physical therapy to try to increase motion along with exercises for her to do at home.  Dispensed ankle compression stocking and surgical shoe for usage at this time  X-ray indicates osteotomies healing well fixation in place no movement with joint open at the current time

## 2020-06-15 DIAGNOSIS — L80 Vitiligo: Secondary | ICD-10-CM | POA: Insufficient documentation

## 2020-06-15 DIAGNOSIS — Z9189 Other specified personal risk factors, not elsewhere classified: Secondary | ICD-10-CM | POA: Insufficient documentation

## 2020-11-04 ENCOUNTER — Other Ambulatory Visit: Payer: Self-pay

## 2020-11-04 ENCOUNTER — Ambulatory Visit (INDEPENDENT_AMBULATORY_CARE_PROVIDER_SITE_OTHER): Payer: 59 | Admitting: Bariatrics

## 2020-11-04 ENCOUNTER — Encounter (INDEPENDENT_AMBULATORY_CARE_PROVIDER_SITE_OTHER): Payer: Self-pay | Admitting: Bariatrics

## 2020-11-04 VITALS — BP 120/67 | HR 72 | Temp 98.2°F | Ht 62.0 in | Wt 215.0 lb

## 2020-11-04 DIAGNOSIS — Z6839 Body mass index (BMI) 39.0-39.9, adult: Secondary | ICD-10-CM

## 2020-11-04 DIAGNOSIS — Z0289 Encounter for other administrative examinations: Secondary | ICD-10-CM

## 2020-11-04 DIAGNOSIS — Z9189 Other specified personal risk factors, not elsewhere classified: Secondary | ICD-10-CM | POA: Diagnosis not present

## 2020-11-04 DIAGNOSIS — R0602 Shortness of breath: Secondary | ICD-10-CM | POA: Diagnosis not present

## 2020-11-04 DIAGNOSIS — G8929 Other chronic pain: Secondary | ICD-10-CM

## 2020-11-04 DIAGNOSIS — I10 Essential (primary) hypertension: Secondary | ICD-10-CM | POA: Diagnosis not present

## 2020-11-04 DIAGNOSIS — R7303 Prediabetes: Secondary | ICD-10-CM | POA: Diagnosis not present

## 2020-11-04 DIAGNOSIS — E559 Vitamin D deficiency, unspecified: Secondary | ICD-10-CM | POA: Insufficient documentation

## 2020-11-04 DIAGNOSIS — K760 Fatty (change of) liver, not elsewhere classified: Secondary | ICD-10-CM

## 2020-11-04 DIAGNOSIS — R5383 Other fatigue: Secondary | ICD-10-CM | POA: Diagnosis not present

## 2020-11-04 DIAGNOSIS — M5430 Sciatica, unspecified side: Secondary | ICD-10-CM | POA: Insufficient documentation

## 2020-11-04 DIAGNOSIS — Z1331 Encounter for screening for depression: Secondary | ICD-10-CM

## 2020-11-04 DIAGNOSIS — I82629 Acute embolism and thrombosis of deep veins of unspecified upper extremity: Secondary | ICD-10-CM | POA: Insufficient documentation

## 2020-11-04 DIAGNOSIS — M25519 Pain in unspecified shoulder: Secondary | ICD-10-CM | POA: Diagnosis not present

## 2020-11-04 DIAGNOSIS — E66812 Obesity, class 2: Secondary | ICD-10-CM | POA: Insufficient documentation

## 2020-11-08 NOTE — Progress Notes (Signed)
Dear Dr. Lysle Rubens,   Thank you for referring Tanya Escobar to our clinic. The following note includes my evaluation and treatment recommendations.  Chief Complaint:   OBESITY LUNETTA Escobar (MR# [id number]) is a 60 y.o. female who presents for evaluation and treatment of obesity and related comorbidities. Current BMI is Body mass index is 39.32 kg/m. Tanya Escobar has been struggling with her weight for many years and has been unsuccessful in either losing weight, maintaining weight loss, or reaching her healthy weight goal.  Tanya Escobar is currently in the action stage of change and ready to dedicate time achieving and maintaining a healthier weight. Tanya Escobar is interested in becoming our patient and working on intensive lifestyle modifications including (but not limited to) diet and exercise for weight loss.  Tanya Escobar likes to [employer] but notes time as an obstacle.  She craves many carbohydrates.  She skips meals.  Tanya Escobar's habits were reviewed today and are as follows: Her family eats meals together, she thinks her family will eat healthier with her, her desired weight loss is 75 pounds, she has been heavy most of her life, she started gaining weight in 1992, her heaviest weight ever was 226 pounds, she craves rice, bread, ice cream, fresh fruit, cheese, and seafood, she snacks frequently in the evenings, she skips breakfast and lunch frequently, she is frequently drinking liquids with calories, she frequently makes poor food choices, she frequently eats larger portions than normal and she struggles with emotional eating.  Depression Screen Tristen's Food and Mood (modified PHQ-9) score was 6.  Depression screen PHQ 2/9 11/04/2020  Decreased Interest 1  Down, Depressed, Hopeless 0  PHQ - 2 Score 1  Altered sleeping 2  Tired, decreased energy 1  Change in appetite 1  Feeling bad or failure about yourself  1  Trouble concentrating 0  Moving slowly or fidgety/restless 0  Suicidal thoughts 0  PHQ-9  Score 6  Difficult doing work/chores Not difficult at all   Subjective:   1. Other fatigue Pavielle denies daytime somnolence and reports waking up still tired. Patent has a history of symptoms of morning fatigue and snoring. Tanya Escobar generally gets 5 or 6 hours of sleep per night, and states that she has generally restful sleep. Snoring is present. Apneic episodes are not present. Epworth Sleepiness Score is 5.  2. SOB (shortness of breath) on exertion Tanya Escobar notes increasing shortness of breath with exercising and seems to be worsening over time with weight gain. She notes getting out of breath sooner with activity than she used to. This has gotten worse recently. Tanya Escobar denies shortness of breath at rest or orthopnea.  3. Essential hypertension Controlled.  Review: taking medications as instructed, no medication side effects noted, no chest pain on exertion, no dyspnea on exertion, no swelling of ankles.  Taking medications as prescribed.  BP Readings from Last 3 Encounters:  11/04/20 120/67  03/06/18 (!) 141/80  12/31/17 123/69   4. Chronic shoulder pain, unspecified laterality Right shoulder.  History of right arm thrombosis.  5. Fatty liver Elevated AST, ALT.  She has seen GI.  6. Pre-diabetes Tanya Escobar has a diagnosis of prediabetes based on her elevated HgA1c and was informed this puts her at greater risk of developing diabetes. She continues to work on diet and exercise to decrease her risk of diabetes. She denies nausea or hypoglycemia.  A1c is 5.7.  7. Depression screen Tanya Escobar was screened for depression as part of her new patient workup.  PHQ-9 is  6.  8. At risk for activity intolerance Tanya Escobar is at risk for activity intolerance due to obesity.  Assessment/Plan:   1. Other fatigue Tanya Escobar does feel that her weight is causing her energy to be lower than it should be. Fatigue may be related to obesity, depression or many other causes. Labs will be ordered, and in the meanwhile,  Tanya Escobar will focus on self care including making healthy food choices, increasing physical activity and focusing on stress reduction.  Gradually increase activity.  Will check EKG today.  - EKG 12-Lead  2. SOB (shortness of breath) on exertion Tanya Escobar does feel that she gets out of breath more easily that she used to when she exercises. Tanya Escobar's shortness of breath appears to be obesity related and exercise induced. She has agreed to work on weight loss and gradually increase exercise to treat her exercise induced shortness of breath. Will continue to monitor closely.  Will check IC today.    3. Essential hypertension Tanya Escobar is working on healthy weight loss and exercise to improve blood pressure control. We will watch for signs of hypotension as she continues her lifestyle modifications.  Continue medication.  4. Chronic shoulder pain, unspecified laterality Gradually increase exercise/mobility.  5. Fatty liver Follow-up with Gastroenterology.   6. Pre-diabetes [name] will continue to work on weight loss, exercise, and decreasing simple carbohydrates to help decrease the risk of diabetes.  Decrease carbohydrates and increase protein and healthy fats.   7. Depression screen Tanya Escobar had a positive depression screening. Depression is commonly associated with obesity and often results in emotional eating behaviors. We will monitor this closely and work on CBT to help improve the non-hunger eating patterns. Referral to Psychology may be required if no improvement is seen as she continues in our clinic.  8. At risk for activity intolerance Tanya Escobar was given approximately 15 minutes of exercise intolerance counseling today. She is 60 y.o. female and has risk factors exercise intolerance including obesity. We discussed intensive lifestyle modifications today with an emphasis on specific weight loss instructions and strategies. Tanya Escobar will slowly increase activity as tolerated.  Repetitive spaced  learning was employed today to elicit superior memory formation and behavioral change.  9. Class 2 severe obesity due to excess calories with serious comorbidity and body mass index (BMI) of 39.0 to 39.9 in adult [facility])  Rmani is currently in the action stage of change and her goal is to continue with weight loss efforts. I recommend Layna begin the structured treatment plan as follows:  She has agreed to the Category 2 Plan.  She will work on meal planning, eliminating sugary drinks, decreasing meal skipping, and decreasing portion sizes.  Labs from 10/18/2020, including CBC, CMP, A1c, lipid panel, TSH, and vitamin D, were reviewed today.  Exercise goals: No exercise has been prescribed at this time.   Behavioral modification strategies: increasing lean protein intake, decreasing simple carbohydrates, increasing vegetables, increasing water intake, decreasing eating out, no skipping meals, meal planning and cooking strategies, keeping healthy foods in the home and planning for success.  She was informed of the importance of frequent follow-up visits to maximize her success with intensive lifestyle modifications for her multiple health conditions. She was informed we would discuss her lab results at her next visit unless there is a critical issue that needs to be addressed sooner. Esperansa agreed to keep her next visit at the agreed upon time to discuss these results.  Objective:   Blood pressure 120/67, pulse 72, temperature 98.2 F (36.8  C), height 5\' 2"  (1.575 m), weight 215 lb (97.5 kg), SpO2 98 %. Body mass index is 39.32 kg/m.  EKG: Normal sinus rhythm, rate 76 bpm.  Indirect Calorimeter completed today shows a VO2 of 226 and a REE of 1574.  Her calculated basal metabolic rate is 5176 thus her basal metabolic rate is worse than expected.  General: Cooperative, alert, well developed, in no acute distress. HEENT: Conjunctivae and lids unremarkable. Cardiovascular: Regular rhythm.   Lungs: Normal work of breathing. Neurologic: No focal deficits.   Lab Results  Component Value Date   CREATININE 0.90 02/08/2015   BUN 18 02/08/2015   NA 138 02/08/2015   K 3.8 02/08/2015   CL 104 02/08/2015   CO2 26 02/08/2015   Lab Results  Component Value Date   ALT 33 02/08/2015   AST 29 02/08/2015   ALKPHOS 86 02/08/2015   BILITOT 0.5 02/08/2015   Lab Results  Component Value Date   WBC 8.7 02/08/2015   HGB 15.0 02/08/2015   HCT 44.0 02/08/2015   MCV 88.6 02/08/2015   PLT 223 02/08/2015   Attestation Statements:   Reviewed by clinician on day of visit: allergies, medications, problem list, medical history, surgical history, family history, social history, and previous encounter notes.  I, Water quality scientist, CMA, am acting as Location manager for [employer], DO  I have reviewed the above documentation for accuracy and completeness, and I agree with the above. Jearld Lesch, DO

## 2020-11-09 ENCOUNTER — Encounter (INDEPENDENT_AMBULATORY_CARE_PROVIDER_SITE_OTHER): Payer: Self-pay | Admitting: Bariatrics

## 2020-11-18 ENCOUNTER — Encounter (INDEPENDENT_AMBULATORY_CARE_PROVIDER_SITE_OTHER): Payer: Self-pay | Admitting: Bariatrics

## 2020-11-18 ENCOUNTER — Ambulatory Visit (INDEPENDENT_AMBULATORY_CARE_PROVIDER_SITE_OTHER): Payer: 59 | Admitting: Bariatrics

## 2020-11-18 ENCOUNTER — Other Ambulatory Visit: Payer: Self-pay

## 2020-11-18 VITALS — BP 119/64 | HR 70 | Temp 98.3°F | Ht 62.0 in | Wt 217.0 lb

## 2020-11-18 DIAGNOSIS — E559 Vitamin D deficiency, unspecified: Secondary | ICD-10-CM

## 2020-11-18 DIAGNOSIS — R7303 Prediabetes: Secondary | ICD-10-CM | POA: Diagnosis not present

## 2020-11-18 DIAGNOSIS — I10 Essential (primary) hypertension: Secondary | ICD-10-CM

## 2020-11-18 DIAGNOSIS — Z6839 Body mass index (BMI) 39.0-39.9, adult: Secondary | ICD-10-CM

## 2020-11-22 NOTE — Progress Notes (Signed)
Chief Complaint:   OBESITY Tanya Escobar is here to discuss her progress with her obesity treatment plan along with follow-up of her obesity related diagnoses. Tanya Escobar is on the Category 2 Plan and states she is following her eating plan approximately 70% of the time. Tanya Escobar states she is walking for 20 minutes 5 times per week.  Today's visit was #: 2 Starting weight: 215 lbs Starting date: 11/04/2020 Today's weight: 217 lbs Today's date: 11/18/2020 Total lbs lost to date: 0 Total lbs lost since last in-office visit: 0  Interim History: Tanya Escobar is up 2 pounds since her initial visit, and according to the scale (bioimpedance) her water is up about 1 pound.  She tried to adhere to the plan.  She has eliminated sodas.  Subjective:   1. Pre-diabetes Tanya Escobar has a diagnosis of prediabetes based on her elevated HgA1c and was informed this puts her at greater risk of developing diabetes. She continues to work on diet and exercise to decrease her risk of diabetes. She denies nausea or hypoglycemia.  A1c 5.7.  2. Essential hypertension Controlled.  Review: taking medications as instructed, no medication side effects noted, no chest pain on exertion, no dyspnea on exertion, no swelling of ankles.  Taking Hyzaar.  BP Readings from Last 3 Encounters:  11/18/20 119/64  11/04/20 120/67  03/06/18 (!) 141/80   3. Vitamin D deficiency Tanya Escobar's Vitamin D level was 75.0. She is currently taking OTC vitamin D 5,000 IU each day. She denies nausea, vomiting or muscle weakness.  Assessment/Plan:   1. Pre-diabetes Tanya Escobar will continue to work on weight loss, exercise, and decreasing simple carbohydrates to help decrease the risk of diabetes.  Decrease carbohydrates and increase healthy fats and protein.  2. Essential hypertension Tanya Escobar is working on healthy weight loss and exercise to improve blood pressure control. We will watch for signs of hypotension as she continues her lifestyle modifications.   Continue medications.  3. Vitamin D deficiency Low Vitamin D level contributes to fatigue and are associated with obesity, breast, and colon cancer.  Continue vitamin D.  4. Obesity, current BMI 39  Tanya Escobar is currently in the action stage of change. As such, her goal is to continue with weight loss efforts. She has agreed to the Category 2 Plan.   She will work on meal planning intentional eating, will adhere closely to the plan, will weigh meat, and will journal breakfast, lunch, and dinner (calories and protein).  Exercise goals: Walking and biking.  Behavioral modification strategies: increasing lean protein intake, decreasing simple carbohydrates, increasing vegetables, increasing water intake, decreasing eating out, no skipping meals, meal planning and cooking strategies, keeping healthy foods in the home and planning for success.  Tanya Escobar has agreed to follow-up with our clinic in 2 weeks. She was informed of the importance of frequent follow-up visits to maximize her success with intensive lifestyle modifications for her multiple health conditions.   Objective:   Blood pressure 119/64, pulse 70, temperature 98.3 F (36.8 C), height 5\' 2"  (1.575 m), weight 217 lb (98.4 kg), SpO2 98 %. Body mass index is 39.69 kg/m.  General: Cooperative, alert, well developed, in no acute distress. HEENT: Conjunctivae and lids unremarkable. Cardiovascular: Regular rhythm.  Lungs: Normal work of breathing. Neurologic: No focal deficits.   Lab Results  Component Value Date   CREATININE 0.90 02/08/2015   BUN 18 02/08/2015   NA 138 02/08/2015   K 3.8 02/08/2015   CL 104 02/08/2015   CO2 26 02/08/2015  Lab Results  Component Value Date   ALT 33 02/08/2015   AST 29 02/08/2015   ALKPHOS 86 02/08/2015   BILITOT 0.5 02/08/2015   Lab Results  Component Value Date   WBC 8.7 02/08/2015   HGB 15.0 02/08/2015   HCT 44.0 02/08/2015   MCV 88.6 02/08/2015   PLT 223 02/08/2015   Attestation  Statements:   Reviewed by clinician on day of visit: allergies, medications, problem list, medical history, surgical history, family history, social history, and previous encounter notes.  I, Water quality scientist, CMA, am acting as Location manager for [employer], DO  I have reviewed the above documentation for accuracy and completeness, and I agree with the above. Jearld Lesch, DO

## 2020-11-23 ENCOUNTER — Encounter (INDEPENDENT_AMBULATORY_CARE_PROVIDER_SITE_OTHER): Payer: Self-pay | Admitting: Bariatrics

## 2020-12-08 ENCOUNTER — Ambulatory Visit (INDEPENDENT_AMBULATORY_CARE_PROVIDER_SITE_OTHER): Payer: 59 | Admitting: Bariatrics

## 2020-12-21 ENCOUNTER — Ambulatory Visit (INDEPENDENT_AMBULATORY_CARE_PROVIDER_SITE_OTHER): Payer: 59 | Admitting: Bariatrics

## 2020-12-21 ENCOUNTER — Encounter (INDEPENDENT_AMBULATORY_CARE_PROVIDER_SITE_OTHER): Payer: Self-pay

## 2020-12-28 ENCOUNTER — Other Ambulatory Visit: Payer: Self-pay | Admitting: Obstetrics and Gynecology

## 2020-12-28 DIAGNOSIS — Z9189 Other specified personal risk factors, not elsewhere classified: Secondary | ICD-10-CM

## 2021-01-08 ENCOUNTER — Other Ambulatory Visit: Payer: Self-pay

## 2021-02-02 ENCOUNTER — Other Ambulatory Visit: Payer: Self-pay | Admitting: Obstetrics and Gynecology

## 2021-02-02 DIAGNOSIS — Z9189 Other specified personal risk factors, not elsewhere classified: Secondary | ICD-10-CM

## 2021-02-19 ENCOUNTER — Ambulatory Visit
Admission: RE | Admit: 2021-02-19 | Discharge: 2021-02-19 | Disposition: A | Discharge status: Home / Self Care | Payer: 59 | Source: Ambulatory Visit | Attending: Obstetrics and Gynecology | Admitting: Obstetrics and Gynecology

## 2021-02-19 ENCOUNTER — Other Ambulatory Visit: Payer: Self-pay

## 2021-02-19 DIAGNOSIS — Z9189 Other specified personal risk factors, not elsewhere classified: Secondary | ICD-10-CM

## 2021-02-19 MED ORDER — GADOBUTROL 1 MMOL/ML IV SOLN
10.0000 mL | Freq: Once | INTRAVENOUS | Status: AC | PRN
  Administered 2021-02-19: 10 mL via INTRAVENOUS

## 2022-03-08 ENCOUNTER — Encounter (INDEPENDENT_AMBULATORY_CARE_PROVIDER_SITE_OTHER): Payer: Self-pay

## 2022-07-04 ENCOUNTER — Ambulatory Visit: Payer: 59

## 2022-07-04 ENCOUNTER — Other Ambulatory Visit: Payer: Self-pay | Admitting: Internal Medicine

## 2022-07-04 DIAGNOSIS — Z1231 Encounter for screening mammogram for malignant neoplasm of breast: Secondary | ICD-10-CM

## 2022-08-29 ENCOUNTER — Ambulatory Visit
Admission: RE | Admit: 2022-08-29 | Discharge: 2022-08-29 | Disposition: A | Discharge status: Home / Self Care | Payer: 59 | Source: Ambulatory Visit | Attending: Internal Medicine | Admitting: Internal Medicine

## 2022-08-29 DIAGNOSIS — Z1231 Encounter for screening mammogram for malignant neoplasm of breast: Secondary | ICD-10-CM

## 2023-10-03 ENCOUNTER — Other Ambulatory Visit: Payer: Self-pay | Admitting: Internal Medicine

## 2023-10-03 DIAGNOSIS — Z1231 Encounter for screening mammogram for malignant neoplasm of breast: Secondary | ICD-10-CM

## 2023-10-09 ENCOUNTER — Ambulatory Visit
Admission: RE | Admit: 2023-10-09 | Discharge: 2023-10-09 | Disposition: A | Discharge status: Home / Self Care | Source: Ambulatory Visit | Attending: Internal Medicine | Admitting: Internal Medicine

## 2023-10-09 DIAGNOSIS — Z1231 Encounter for screening mammogram for malignant neoplasm of breast: Secondary | ICD-10-CM

## 2024-04-02 ENCOUNTER — Other Ambulatory Visit: Payer: Self-pay | Admitting: Medical Genetics

## 2024-07-03 ENCOUNTER — Other Ambulatory Visit

## 2024-08-04 ENCOUNTER — Other Ambulatory Visit: Payer: Self-pay | Admitting: Medical Genetics

## 2024-08-04 DIAGNOSIS — Z006 Encounter for examination for normal comparison and control in clinical research program: Secondary | ICD-10-CM
